# Patient Record
Sex: Female | Born: 1995 | Race: White | Hispanic: No | Marital: Married | Smoking: Former smoker
Health system: Southern US, Community
[De-identification: ages and names within clinical notes are randomized; demographics above are authoritative.]

## PROBLEM LIST (undated history)

## (undated) DIAGNOSIS — F419 Anxiety disorder, unspecified: Secondary | ICD-10-CM

## (undated) DIAGNOSIS — N200 Calculus of kidney: Secondary | ICD-10-CM

## (undated) DIAGNOSIS — Z789 Other specified health status: Secondary | ICD-10-CM

## (undated) DIAGNOSIS — N83201 Unspecified ovarian cyst, right side: Secondary | ICD-10-CM

## (undated) HISTORY — DX: Calculus of kidney: N20.0

## (undated) HISTORY — PX: NO PAST SURGERIES: SHX2092

---
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
## 2015-05-21 ENCOUNTER — Ambulatory Visit (INDEPENDENT_AMBULATORY_CARE_PROVIDER_SITE_OTHER): Payer: Self-pay | Admitting: Physician Assistant

## 2015-05-21 VITALS — BP 110/78 | HR 88 | Temp 98.1°F | Resp 16 | Ht 63.0 in | Wt 173.6 lb

## 2015-05-21 DIAGNOSIS — R109 Unspecified abdominal pain: Secondary | ICD-10-CM

## 2015-05-21 DIAGNOSIS — R300 Dysuria: Secondary | ICD-10-CM

## 2015-05-21 LAB — POCT CBC
GRANULOCYTE PERCENT: 76.4 % (ref 37–80)
HEMATOCRIT: 38 % (ref 37.7–47.9)
HEMOGLOBIN: 12.9 g/dL (ref 12.2–16.2)
LYMPH, POC: 2.5 (ref 0.6–3.4)
MCH, POC: 27.3 pg (ref 27–31.2)
MCHC: 33.8 g/dL (ref 31.8–35.4)
MCV: 80.7 fL (ref 80–97)
MID (cbc): 0.8 (ref 0–0.9)
MPV: 10.1 fL (ref 0–99.8)
POC GRANULOCYTE: 10.5 — AB (ref 2–6.9)
POC LYMPH PERCENT: 17.8 %L (ref 10–50)
POC MID %: 5.8 %M (ref 0–12)
Platelet Count, POC: 212 10*3/uL (ref 142–424)
RBC: 4.71 M/uL (ref 4.04–5.48)
RDW, POC: 13.6 %
WBC: 13.8 10*3/uL — AB (ref 4.6–10.2)

## 2015-05-21 LAB — POCT URINALYSIS DIP (MANUAL ENTRY)
BILIRUBIN UA: NEGATIVE
Bilirubin, UA: NEGATIVE
Glucose, UA: NEGATIVE
Nitrite, UA: NEGATIVE
PH UA: 7
PROTEIN UA: NEGATIVE
SPEC GRAV UA: 1.02
Urobilinogen, UA: 0.2

## 2015-05-21 LAB — POCT URINE PREGNANCY: Preg Test, Ur: NEGATIVE

## 2015-05-21 LAB — POC MICROSCOPIC URINALYSIS (UMFC): Mucus: ABSENT

## 2015-05-21 MED ORDER — CIPROFLOXACIN HCL 500 MG PO TABS: 500.0000 mg | ORAL_TABLET | Freq: Two times a day (BID) | ORAL | Status: AC

## 2015-05-21 NOTE — Progress Notes (Signed)
  Medical screening examination/treatment/procedure(s) were performed by non-physician practitioner and as supervising physician I was immediately available for consultation/collaboration.     

## 2015-05-21 NOTE — Patient Instructions (Signed)
Get plenty of rest and drink at least 64 ounces of water daily.   Pyelonephritis, Adult Pyelonephritis is a kidney infection. The kidneys are the organs that filter a person's blood and move waste out of the bloodstream and into the urine. Urine passes from the kidneys, through the ureters, and into the bladder. There are two main types of pyelonephritis:  Infections that come on quickly without any warning (acute pyelonephritis).  Infections that last for a long period of time (chronic pyelonephritis). In most cases, the infection clears up with treatment and does not cause further problems. More severe infections or chronic infections can sometimes spread to the bloodstream or lead to other problems with the kidneys. CAUSES This condition is usually caused by:  Bacteria traveling from the bladder to the kidney through infected urine. The urine in the bladder can become infected with bacteria from:  Bladder infection (cystitis).  Inflammation of the prostate gland (prostatitis).  Sexual intercourse, in females.  Bacteria traveling from the bloodstream to the kidney. RISK FACTORS This condition is more likely to develop in:  Pregnant women.  Older people.  People who have diabetes.  People who have kidney stones or bladder stones.  People who have other abnormalities of the kidney or ureter.  People who have a catheter placed in the bladder.  People who have cancer.  People who are sexually active.  Women who use spermicides.  People who have had a prior urinary tract infection. SYMPTOMS Symptoms of this condition include:  Frequent urination.  Strong or persistent urge to urinate.  Burning or stinging when urinating.  Abdominal pain.  Back pain.  Pain in the side or flank area.  Fever.  Chills.  Blood in the urine, or dark urine.  Nausea.  Vomiting. DIAGNOSIS This condition may be diagnosed based on:  Medical history and physical exam.  Urine  tests.  Blood tests. You may also have imaging tests of the kidneys, such as an ultrasound or CT scan. TREATMENT Treatment for this condition may depend on the severity of the infection.  If the infection is mild and is found early, you may be treated with antibiotic medicines taken by mouth. You will need to drink fluids to remain hydrated.  If the infection is more severe, you may need to stay in the hospital and receive antibiotics given directly into a vein through an IV tube. You may also need to receive fluids through an IV tube if you are not able to remain hydrated. After your hospital stay, you may need to take oral antibiotics for a period of time. Other treatments may be required, depending on the cause of the infection. HOME CARE INSTRUCTIONS Medicines  Take over-the-counter and prescription medicines only as told by your health care provider.  If you were prescribed an antibiotic medicine, take it as told by your health care provider. Do not stop taking the antibiotic even if you start to feel better. General Instructions  Drink enough fluid to keep your urine clear or pale yellow.  Avoid caffeine, tea, and carbonated beverages. They tend to irritate the bladder.  Urinate often. Avoid holding in urine for long periods of time.  Urinate before and after sex.  After a bowel movement, women should cleanse from front to back. Use each tissue only once.  Keep all follow-up visits as told by your health care provider. This is important. SEEK MEDICAL CARE IF:  Your symptoms do not get better after 2 days of treatment.  Your  symptoms get worse.  You have a fever. SEEK IMMEDIATE MEDICAL CARE IF:  You are unable to take your antibiotics or fluids.  You have shaking chills.  You vomit.  You have severe flank or back pain.  You have extreme weakness or fainting.   This information is not intended to replace advice given to you by your health care provider. Make sure  you discuss any questions you have with your health care provider.   Document Released: 04/18/2005 Document Revised: 01/07/2015 Document Reviewed: 08/11/2014 Elsevier Interactive Patient Education [employer].

## 2015-05-21 NOTE — Addendum Note (Signed)
Addended by: Roselee Culver on: 05/21/2015 04:53 PM   Modules accepted: Miquel Dunn

## 2015-05-21 NOTE — Progress Notes (Signed)
Subjective:    Patient ID: Selena Shepard, female    DOB: 10/18/1995, 20 y.o.   MRN: [id number]  Chief Complaint  Patient presents with  . Urinary Frequency    05/21/2015  . Abdominal Pain    HPI Patient presents today with chief complain of abdominal pain and increased urinary frequency. Past medical history includes nephrolithiasis. She first started noticing her hip/flank pain b/l when it awoke her around 12am, and would keep her up the rest of the night. She notes there is a constant dull pain, which she rates as a 3/10, and a strong pain that comes on/off, which she rates as a 6-7/10 and lasts 20 seconds. Around 3-4am she felt like she had to urinate but when tried she was unable too. She notes she would sit on the toliet for 20-30 minutes before being able to go. When she does, she notes a burning sensation, a decreased amount (drops), and blood in her urine. After being able to pass, the urge would return shortly after. She has not tried anything to relieve the pain.    She presents today with her husband. Her last sexual encounter was 05/18/15. Her LMP was the end of December, she is unsure of the date. Her periods are irregular ranging  she is not regular between every 19-30 days and states this is a different type of pain.   Review of Systems  Constitutional: Positive for chills and appetite change. Negative for fever, diaphoresis and fatigue.  Respiratory: Negative for cough and shortness of breath.   Cardiovascular: Negative for chest pain and palpitations.  Gastrointestinal: Negative for nausea, vomiting, diarrhea and abdominal distention.  Genitourinary: Positive for dysuria, urgency, frequency, hematuria, flank pain and difficulty urinating. Negative for vaginal bleeding, vaginal discharge and vaginal pain.   No Known Allergies  No Prior Medications.   There are no active problems to display for this patient.      Objective:   Physical Exam  Constitutional:  She appears well-developed and well-nourished.  BP 110/78 mmHg  Pulse 88  Temp(Src) 98.1 F (36.7 C) (Oral)  Resp 16  Ht 5\' 3"  (1.6 m)  Wt 173 lb 9.6 oz (78.744 kg)  BMI 30.76 kg/m2  LMP 05/27/2014   HENT:  Head: Normocephalic and atraumatic.  Mouth/Throat: No oropharyngeal exudate.  Eyes: Conjunctivae are normal. Pupils are equal, round, and reactive to light.  Neck: Neck supple.  Cardiovascular: Normal rate, regular rhythm, S1 normal, S2 normal and normal heart sounds.   Pulmonary/Chest: Effort normal and breath sounds normal.  Abdominal: She exhibits no mass. There is no hepatosplenomegaly. There is tenderness in the right lower quadrant, suprapubic area and left lower quadrant. There is CVA tenderness (b/l). There is no rigidity, no rebound, no guarding, no tenderness at McBurney's point and negative Murphy's sign.  Patient did not want to expose abdomen. Unable to hear bowel sounds over gown.   Lymphadenopathy:       Head (right side): No submental, no submandibular, no tonsillar, no preauricular, no posterior auricular and no occipital adenopathy present.       Head (left side): No submental, no submandibular, no tonsillar, no preauricular, no posterior auricular and no occipital adenopathy present.    She has no cervical adenopathy.  Vitals reviewed.  Results for orders placed or performed in visit on 05/21/15  POCT CBC  Result Value Ref Range   WBC 13.8 (A) 4.6 - 10.2 K/uL   Lymph, poc 2.5 0.6 - 3.4  POC LYMPH PERCENT 17.8 10 - 50 %L   MID (cbc) 0.8 0 - 0.9   POC MID % 5.8 0 - 12 %M   POC Granulocyte 10.5 (A) 2 - 6.9   Granulocyte percent 76.4 37 - 80 %G   RBC 4.71 4.04 - 5.48 M/uL   Hemoglobin 12.9 12.2 - 16.2 g/dL   HCT, POC 38.0 37.7 - 47.9 %   MCV 80.7 80 - 97 fL   MCH, POC 27.3 27 - 31.2 pg   MCHC 33.8 31.8 - 35.4 g/dL   RDW, POC 13.6 %   Platelet Count, POC 212 142 - 424 K/uL   MPV 10.1 0 - 99.8 fL  POCT urinalysis dipstick  Result Value Ref Range    Color, UA yellow yellow   Clarity, UA clear clear   Glucose, UA negative negative   Bilirubin, UA negative negative   Ketones, POC UA negative negative   Spec Grav, UA 1.020    Blood, UA moderate (A) negative   pH, UA 7.0    Protein Ur, POC negative negative   Urobilinogen, UA 0.2    Nitrite, UA Negative Negative   Leukocytes, UA large (3+) (A) Negative  POCT Microscopic Urinalysis ([facility])  Result Value Ref Range   WBC,UR,HPF,POC Too numerous to count  (A) None WBC/hpf   RBC,UR,HPF,POC Few (A) None RBC/hpf   Bacteria Moderate (A) None, Too numerous to count   Mucus Absent Absent   Epithelial Cells, UR Per Microscopy Few (A) None, Too numerous to count cells/hpf  POCT urine pregnancy  Result Value Ref Range   Preg Test, Ur Negative Negative        Assessment & Plan:  1. Bilateral flank pain 2. Dysuria - POCT CBC - POCT urinalysis dipstick - POCT Microscopic Urinalysis ([facility]) - POCT urine pregnancy - ciprofloxacin (CIPRO) 500 MG tablet; Take 1 tablet (500 mg total) by mouth 2 (two) times daily.  Dispense: 20 tablet; Refill: 0 - Urine culture  Return in about 2 days (around 05/23/2015) for re-evaluation, sooner if your symptoms worsen.

## 2015-05-21 NOTE — Progress Notes (Signed)
Subjective:   Patient ID: Selena Shepard, female     DOB: January 15, 1996, 20 y.o.    MRN: [id number]  PCP: No primary care provider on file.  Chief Complaint  Patient presents with  . Urinary Frequency    05/21/2015  . Abdominal Pain    HPI  Presents for evaluation of abdominal pain and increased urinary frequency.   Past medical history includes nephrolithiasis. She first started noticing her hip/flank pain b/l when it awoke her around 12am, and would keep her up the rest of the night. She notes there is a constant dull pain, which she rates as a 3/10, and a strong pain that comes on/off, which she rates as a 6-7/10 and lasts 20 seconds. Around 3-4am she felt like she had to urinate but when tried she was unable too. She notes she would sit on the toliet for 20-30 minutes before being able to go. When she does, she notes a burning sensation, a decreased amount (drops), and blood in her urine. After being able to pass, the urge would return shortly after. She has not tried anything to relieve the pain.   She presents today with her husband. Her last sexual encounter was 05/18/15. Her LMP was the end of December, she is unsure of the date. Her periods are irregular ranging19-30 days and states this is a different type of pain. .  She is accompanied today by her husband and his mother.  Prior to Admission medications   Not on File     No Known Allergies   There are no active problems to display for this patient.    Family History  Problem Relation Age of Onset  . Diabetes Paternal Grandmother   . Heart disease Paternal Grandmother   . Hypertension Paternal Grandmother   . Stroke Paternal Grandfather   . Hypertension Paternal Grandfather      Social History   Social History  . Marital Status: Married    Spouse Name: N/A  . Number of Children: N/A  . Years of Education: N/A   Occupational History  . Not on file.   Social History Main Topics  . Smoking  status: Current Some Day Smoker -- 4 years    Types: Cigarettes  . Smokeless tobacco: Never Used     Comment: Social smoker, 4-5 cigarettes, 2x/year  . Alcohol Use: No  . Drug Use: No  . Sexual Activity: Yes    Birth Control/ Protection: Condom   Other Topics Concern  . Not on file   Social History Narrative        Review of Systems Constitutional: Positive for chills and appetite change. Negative for fever, diaphoresis and fatigue.  Respiratory: Negative for cough and shortness of breath.  Cardiovascular: Negative for chest pain and palpitations.  Gastrointestinal: Negative for nausea, vomiting, diarrhea and abdominal distention.  Genitourinary: Positive for dysuria, urgency, frequency, hematuria, flank pain and difficulty urinating. Negative for vaginal bleeding, vaginal discharge and vaginal pain.       Objective:  Physical Exam  Constitutional: She is oriented to person, place, and time. Vital signs are normal. She appears well-developed and well-nourished. No distress.  BP 110/78 mmHg  Pulse 88  Temp(Src) 98.1 F (36.7 C) (Oral)  Resp 16  Ht 5\' 3"  (1.6 m)  Wt 173 lb 9.6 oz (78.744 kg)  BMI 30.76 kg/m2  LMP 05/27/2014   HENT:  Head: Normocephalic and atraumatic.  Cardiovascular: Normal rate, regular rhythm and normal heart sounds.  Pulmonary/Chest: Effort normal and breath sounds normal.  Abdominal: Soft. Normal appearance and bowel sounds are normal. She exhibits no distension and no mass. There is no hepatosplenomegaly. There is tenderness in the right lower quadrant, suprapubic area and left lower quadrant. There is CVA tenderness (bilateral). There is no rigidity, no rebound, no guarding, no tenderness at McBurney's point and negative Murphy's sign. No hernia.  Very reluctant to expose her abdomen for exam, because she is self-conscious of her weight. She did allow for a complete exam.  Musculoskeletal: Normal range of motion.       Lumbar back: Normal.    Neurological: She is alert and oriented to person, place, and time.  Skin: Skin is warm and dry. No rash noted. She is not diaphoretic. No pallor.  Psychiatric: She has a normal mood and affect. Her speech is normal and behavior is normal. Judgment normal.       Results for orders placed or performed in visit on 05/21/15  POCT CBC  Result Value Ref Range   WBC 13.8 (A) 4.6 - 10.2 K/uL   Lymph, poc 2.5 0.6 - 3.4   POC LYMPH PERCENT 17.8 10 - 50 %L   MID (cbc) 0.8 0 - 0.9   POC MID % 5.8 0 - 12 %M   POC Granulocyte 10.5 (A) 2 - 6.9   Granulocyte percent 76.4 37 - 80 %G   RBC 4.71 4.04 - 5.48 M/uL   Hemoglobin 12.9 12.2 - 16.2 g/dL   HCT, POC 38.0 37.7 - 47.9 %   MCV 80.7 80 - 97 fL   MCH, POC 27.3 27 - 31.2 pg   MCHC 33.8 31.8 - 35.4 g/dL   RDW, POC 13.6 %   Platelet Count, POC 212 142 - 424 K/uL   MPV 10.1 0 - 99.8 fL  POCT urinalysis dipstick  Result Value Ref Range   Color, UA yellow yellow   Clarity, UA clear clear   Glucose, UA negative negative   Bilirubin, UA negative negative   Ketones, POC UA negative negative   Spec Grav, UA 1.020    Blood, UA moderate (A) negative   pH, UA 7.0    Protein Ur, POC negative negative   Urobilinogen, UA 0.2    Nitrite, UA Negative Negative   Leukocytes, UA large (3+) (A) Negative  POCT Microscopic Urinalysis ([facility])  Result Value Ref Range   WBC,UR,HPF,POC Too numerous to count  (A) None WBC/hpf   RBC,UR,HPF,POC Few (A) None RBC/hpf   Bacteria Moderate (A) None, Too numerous to count   Mucus Absent Absent   Epithelial Cells, UR Per Microscopy Few (A) None, Too numerous to count cells/hpf  POCT urine pregnancy  Result Value Ref Range   Preg Test, Ur Negative Negative         Assessment & Plan:  1. Bilateral flank pain 2. Dysuria Early pyelonephritis, though nephrolithiasis cannot be excluded. Re-evaluate in 48 hours, sooner if symptoms worsen. Consider CT abd/pelvis if not improving. - POCT CBC - POCT urinalysis  dipstick - POCT Microscopic Urinalysis ([facility]) - POCT urine pregnancy - ciprofloxacin (CIPRO) 500 MG tablet; Take 1 tablet (500 mg total) by mouth 2 (two) times daily.  Dispense: 20 tablet; Refill: 0 - Urine culture   Fara Chute, PA-C Physician Assistant-Certified [facility]

## 2015-05-22 ENCOUNTER — Telehealth: Payer: Self-pay

## 2015-05-22 NOTE — Telephone Encounter (Signed)
Patient is experiencing side effects from taking ciprofloxacin. She's having chest pain, sluggishness, and limbs numbness. She's also having trouble sleeping. Please advise!  [phone]

## 2015-05-23 MED ORDER — SULFAMETHOXAZOLE-TRIMETHOPRIM 800-160 MG PO TABS: 1.0000 | ORAL_TABLET | Freq: Two times a day (BID) | ORAL | Status: AC

## 2015-05-23 NOTE — Telephone Encounter (Signed)
Patient called this am requesting a different abx. She states the cipro has too many side effects. It made her feel dizzy, faint, heart beat felt irregular. Please advise

## 2015-05-23 NOTE — Telephone Encounter (Signed)
I have changed to septra.

## 2015-05-23 NOTE — Telephone Encounter (Signed)
Patient notified and voiced understanding.

## 2015-05-25 ENCOUNTER — Telehealth: Payer: Self-pay

## 2015-05-25 NOTE — Telephone Encounter (Signed)
Please call the patient and tell her what happened. If she is still having symptoms, RTC for re-evaluation. If symptoms are resolved, apologize, and she can RTC PRN.

## 2015-05-25 NOTE — Telephone Encounter (Signed)
Urine culture was sent without pt name/dob. Anything I can do?

## 2015-05-26 NOTE — Telephone Encounter (Signed)
Left message on machine to call back  

## 2015-05-31 NOTE — Telephone Encounter (Signed)
Pt notified and is now better

## 2015-07-17 ENCOUNTER — Ambulatory Visit (INDEPENDENT_AMBULATORY_CARE_PROVIDER_SITE_OTHER): Payer: Self-pay | Admitting: Family Medicine

## 2015-07-17 VITALS — BP 112/70 | HR 105 | Temp 98.3°F | Resp 18 | Ht 63.0 in | Wt 177.2 lb

## 2015-07-17 DIAGNOSIS — R05 Cough: Secondary | ICD-10-CM

## 2015-07-17 DIAGNOSIS — J111 Influenza due to unidentified influenza virus with other respiratory manifestations: Secondary | ICD-10-CM

## 2015-07-17 DIAGNOSIS — R059 Cough, unspecified: Secondary | ICD-10-CM

## 2015-07-17 MED ORDER — OSELTAMIVIR PHOSPHATE 75 MG PO CAPS: 75.0000 mg | ORAL_CAPSULE | Freq: Two times a day (BID) | ORAL | Status: DC

## 2015-07-17 MED ORDER — HYDROCODONE-HOMATROPINE 5-1.5 MG/5ML PO SYRP: 5.0000 mL | ORAL_SOLUTION | Freq: Three times a day (TID) | ORAL | Status: DC | PRN

## 2015-07-17 NOTE — Patient Instructions (Signed)
Drink plenty of fluids and get enough rest  Take Tamiflu 75 mg one twice daily. This usually shortens the length and severity of the course, but you will still be sick for a few days probably.  Take Tylenol 500 mg 2 pills 3 times daily as needed for fever or body aches (acetaminophen) or ibuprofen 200 mg 3 pills 3 times daily also for fever or body ache  Take Hycodan cough syrup 1 teaspoon every 4-6 hours as needed for cough  If you're having nasal congestion and drainage you can take over-the-counter generic Claritin-D or Allegra-D or Zyrtec-D to try and open up your hands little bit.(Loratadine D, fexofenadine D, or cetirizine D are the generics)  Return if increasing shortness of breath, worse fever, getting sicker or any other changes of concern.

## 2015-07-17 NOTE — Progress Notes (Signed)
Patient ID: Selena Shepard, female    DOB: [birth year]  Age: 20 y.o. MRN: [id number]  Chief Complaint  Patient presents with  . Fever    unspecified  . Sore Throat  . Cough    Slightly productive  . Nasal Congestion    x 1 week  . Generalized Body Aches    Subjective:   Healthy-appearing young lady who is here with history of having been ill with a little congestion over the last week or so. However it was yesterday that she started getting sick with fever. She has had a temperature up to 101. She has some body aches. She is coughing more. She still has some nasal congestion. She does not smoke. 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  Current allergies, medications, problem list, past/family and social histories reviewed.  Objective:  BP 112/70 mmHg  Pulse 105  Temp(Src) 98.3 F (36.8 C) (Oral)  Resp 18  Ht 5\' 3"  (1.6 m)  Wt 177 lb 3.2 oz (80.377 kg)  BMI 31.40 kg/m2  SpO2 98%  LMP 07/08/2015  No major acute distress. TMs normal. Throat mild erythema. Neck supple without significant nodes. Chest is clear to auscultation. Heart regular without murmur.  Assessment & Plan:   Assessment: No diagnosis found.    Plan: Clinically she has the flu. Decided not to do a flu swab and just go ahead and treat  No orders of the defined types were placed in this encounter.    Meds ordered this encounter  Medications  . Acetaminophen (TYLENOL PO)    Sig: Take by mouth.         There are no Patient Instructions on file for this visit.   No Follow-up on file.   Ikran Patman, MD 07/17/2015

## 2016-02-13 ENCOUNTER — Inpatient Hospital Stay (HOSPITAL_COMMUNITY)
Admission: AD | Admit: 2016-02-13 | Discharge: 2016-02-14 | Disposition: A | Discharge status: Home / Self Care | Payer: Self-pay | Source: Ambulatory Visit | Attending: Obstetrics and Gynecology | Admitting: Obstetrics and Gynecology

## 2016-02-13 ENCOUNTER — Encounter (HOSPITAL_COMMUNITY): Payer: Self-pay | Admitting: *Deleted

## 2016-02-13 DIAGNOSIS — Z87891 Personal history of nicotine dependence: Secondary | ICD-10-CM | POA: Insufficient documentation

## 2016-02-13 DIAGNOSIS — M545 Low back pain: Secondary | ICD-10-CM | POA: Insufficient documentation

## 2016-02-13 DIAGNOSIS — R102 Pelvic and perineal pain: Secondary | ICD-10-CM | POA: Insufficient documentation

## 2016-02-13 HISTORY — DX: Other specified health status: Z78.9

## 2016-02-13 LAB — URINALYSIS, ROUTINE W REFLEX MICROSCOPIC
Bilirubin Urine: NEGATIVE
GLUCOSE, UA: NEGATIVE mg/dL
HGB URINE DIPSTICK: NEGATIVE
Ketones, ur: NEGATIVE mg/dL
LEUKOCYTES UA: NEGATIVE
Nitrite: NEGATIVE
PROTEIN: NEGATIVE mg/dL
SPECIFIC GRAVITY, URINE: 1.01 (ref 1.005–1.030)
pH: 6 (ref 5.0–8.0)

## 2016-02-13 LAB — POCT PREGNANCY, URINE: PREG TEST UR: NEGATIVE

## 2016-02-13 NOTE — MAU Note (Signed)
Having pain for couple days R side that goesacross lower back. Usually comes and goes but has been constant, sharp pain for last hour and half. LMP 8/12. Did upt but does not think did it correctly. Denies vag spotting. Some clear d/c

## 2016-02-14 DIAGNOSIS — R102 Pelvic and perineal pain: Secondary | ICD-10-CM

## 2016-02-14 LAB — CBC
HCT: 38 % (ref 36.0–46.0)
Hemoglobin: 12.8 g/dL (ref 12.0–15.0)
MCH: 27.2 pg (ref 26.0–34.0)
MCHC: 33.7 g/dL (ref 30.0–36.0)
MCV: 80.7 fL (ref 78.0–100.0)
PLATELETS: 278 10*3/uL (ref 150–400)
RBC: 4.71 MIL/uL (ref 3.87–5.11)
RDW: 14.1 % (ref 11.5–15.5)
WBC: 11.1 10*3/uL — AB (ref 4.0–10.5)

## 2016-02-14 LAB — HCG, QUANTITATIVE, PREGNANCY: hCG, Beta Chain, Quant, S: <1 m[IU]/mL (ref <5)

## 2016-02-14 LAB — WET PREP, GENITAL
Clue Cells Wet Prep HPF POC: NONE SEEN
Sperm: NONE SEEN
TRICH WET PREP: NONE SEEN
WBC, Wet Prep HPF POC: NONE SEEN
YEAST WET PREP: NONE SEEN

## 2016-02-14 MED ORDER — IBUPROFEN 600 MG PO TABS
600.0000 mg | ORAL_TABLET | Freq: Once | ORAL | Status: AC
  Administered 2016-02-14: 600 mg via ORAL
  Filled 2016-02-14: qty 1

## 2016-02-14 MED ORDER — IBUPROFEN 600 MG PO TABS: 600.0000 mg | ORAL_TABLET | Freq: Four times a day (QID) | ORAL | 0 refills | Status: DC | PRN

## 2016-02-14 NOTE — Discharge Instructions (Signed)
Pelvic Pain, Female °Female pelvic pain can be caused by many different things and start from a variety of places. Pelvic pain refers to pain that is located in the lower half of the abdomen and between your hips. The pain may occur over a short period of time (acute) or may be reoccurring (chronic). The cause of pelvic pain may be related to disorders affecting the female reproductive organs (gynecologic), but it may also be related to the bladder, kidney stones, an intestinal complication, or muscle or skeletal problems. Getting help right away for pelvic pain is important, especially if there has been severe, sharp, or a sudden onset of unusual pain. It is also important to get help right away because some types of pelvic pain can be life threatening.  °CAUSES  °Below are only some of the causes of pelvic pain. The causes of pelvic pain can be in one of several categories.  °· Gynecologic. °¨ Pelvic inflammatory disease. °¨ Sexually transmitted infection. °¨ Ovarian cyst or a twisted ovarian ligament (ovarian torsion). °¨ Uterine lining that grows outside the uterus (endometriosis). °¨ Fibroids, cysts, or tumors. °¨ Ovulation. °· Pregnancy. °¨ Pregnancy that occurs outside the uterus (ectopic pregnancy). °¨ Miscarriage. °¨ Labor. °¨ Abruption of the placenta or ruptured uterus. °· Infection. °¨ Uterine infection (endometritis). °¨ Bladder infection. °¨ Diverticulitis. °¨ Miscarriage related to a uterine infection (septic abortion). °· Bladder. °¨ Inflammation of the bladder (cystitis). °¨ Kidney stone(s). °· Gastrointestinal. °¨ Constipation. °¨ Diverticulitis. °· Neurologic. °¨ Trauma. °¨ Feeling pelvic pain because of mental or emotional causes (psychosomatic). °· Cancers of the bowel or pelvis. °EVALUATION  °Your caregiver will want to take a careful history of your concerns. This includes recent changes in your health, a careful gynecologic history of your periods (menses), and a sexual history. Obtaining  your family history and medical history is also important. Your caregiver may suggest a pelvic exam. A pelvic exam will help identify the location and severity of the pain. It also helps in the evaluation of which organ system may be involved. In order to identify the cause of the pelvic pain and be properly treated, your caregiver may order tests. These tests may include:  °· A pregnancy test. °· Pelvic ultrasonography. °· An X-ray exam of the abdomen. °· A urinalysis or evaluation of vaginal discharge. °· Blood tests. °HOME CARE INSTRUCTIONS  °· Only take over-the-counter or prescription medicines for pain, discomfort, or fever as directed by your caregiver.   °· Rest as directed by your caregiver.   °· Eat a balanced diet.   °· Drink enough fluids to make your urine clear or pale yellow, or as directed.   °· Avoid sexual intercourse if it causes pain.   °· Apply warm or cold compresses to the lower abdomen depending on which one helps the pain.   °· Avoid stressful situations.   °· Keep a journal of your pelvic pain. Write down when it started, where the pain is located, and if there are things that seem to be associated with the pain, such as food or your menstrual cycle. °· Follow up with your caregiver as directed.   °SEEK MEDICAL CARE IF: °· Your medicine does not help your pain. °· You have abnormal vaginal discharge. °SEEK IMMEDIATE MEDICAL CARE IF:  °· You have heavy bleeding from the vagina.   °· Your pelvic pain increases.   °· You feel light-headed or faint.   °· You have chills.   °· You have pain with urination or blood in your urine.   °· You have uncontrolled   diarrhea or vomiting.   You have a fever or persistent symptoms for more than 3 days.  You have a fever and your symptoms suddenly get worse.   You are being physically or sexually abused.   This information is not intended to replace advice given to you by your health care provider. Make sure you discuss any questions you have with  your health care provider.   Document Released: 03/15/2004 Document Revised: 01/07/2015 Document Reviewed: 08/08/2011 Elsevier Interactive Patient Education 2016 Elsevier Inc. Dysmenorrhea Menstrual cramps (dysmenorrhea) are caused by the muscles of the uterus tightening (contracting) during a menstrual period. For some women, this discomfort is merely bothersome. For others, dysmenorrhea can be severe enough to interfere with everyday activities for a few days each month. Primary dysmenorrhea is menstrual cramps that last a couple of days when you start having menstrual periods or soon after. This often begins after a teenager starts having her period. As a woman gets older or has a baby, the cramps will usually lessen or disappear. Secondary dysmenorrhea begins later in life, lasts longer, and the pain may be stronger than primary dysmenorrhea. The pain may start before the period and last a few days after the period.  CAUSES  Dysmenorrhea is usually caused by an underlying problem, such as:  The tissue lining the uterus grows outside of the uterus in other areas of the body (endometriosis).  The endometrial tissue, which normally lines the uterus, is found in or grows into the muscular walls of the uterus (adenomyosis).  The pelvic blood vessels are engorged with blood just before the menstrual period (pelvic congestive syndrome).  Overgrowth of cells (polyps) in the lining of the uterus or cervix.  Falling down of the uterus (prolapse) because of loose or stretched ligaments.  Depression.  Bladder problems, infection, or inflammation.  Problems with the intestine, a tumor, or irritable bowel syndrome.  Cancer of the female organs or bladder.  A severely tipped uterus.  A very tight opening or closed cervix.  Noncancerous tumors of the uterus (fibroids).  Pelvic inflammatory disease (PID).  Pelvic scarring (adhesions) from a previous surgery.  Ovarian cyst.  An  intrauterine device (IUD) used for birth control. RISK FACTORS You may be at greater risk of dysmenorrhea if:  You are younger than age 65.  You started puberty early.  You have irregular or heavy bleeding.  You have never given birth.  You have a family history of this problem.  You are a smoker. SIGNS AND SYMPTOMS   Cramping or throbbing pain in your lower abdomen.  Headaches.  Lower back pain.  Nausea or vomiting.  Diarrhea.  Sweating or dizziness.  Loose stools. DIAGNOSIS  A diagnosis is based on your history, symptoms, physical exam, diagnostic tests, or procedures. Diagnostic tests or procedures may include:  Blood tests.  Ultrasonography.  An examination of the lining of the uterus (dilation and curettage, D&C).  An examination inside your abdomen or pelvis with a scope (laparoscopy).  X-rays.  CT scan.  MRI.  An examination inside the bladder with a scope (cystoscopy).  An examination inside the intestine or stomach with a scope (colonoscopy, gastroscopy). TREATMENT  Treatment depends on the cause of the dysmenorrhea. Treatment may include:  Pain medicine prescribed by your health care provider.  Birth control pills or an IUD with progesterone hormone in it.  Hormone replacement therapy.  Nonsteroidal anti-inflammatory drugs (NSAIDs). These may help stop the production of prostaglandins.  Surgery to remove adhesions, endometriosis, ovarian  cyst, or fibroids.  Removal of the uterus (hysterectomy).  Progesterone shots to stop the menstrual period.  Cutting the nerves on the sacrum that go to the female organs (presacral neurectomy).  Electric current to the sacral nerves (sacral nerve stimulation).  Antidepressant medicine.  Psychiatric therapy, counseling, or group therapy.  Exercise and physical therapy.  Meditation and yoga therapy.  Acupuncture. HOME CARE INSTRUCTIONS   Only take over-the-counter or prescription medicines as  directed by your health care provider.  Place a heating pad or hot water bottle on your lower back or abdomen. Do not sleep with the heating pad.  Use aerobic exercises, walking, swimming, biking, and other exercises to help lessen the cramping.  Massage to the lower back or abdomen may help.  Stop smoking.  Avoid alcohol and caffeine. SEEK MEDICAL CARE IF:   Your pain does not get better with medicine.  You have pain with sexual intercourse.  Your pain increases and is not controlled with medicines.  You have abnormal vaginal bleeding with your period.  You develop nausea or vomiting with your period that is not controlled with medicine. SEEK IMMEDIATE MEDICAL CARE IF:  You pass out.    This information is not intended to replace advice given to you by your health care provider. Make sure you discuss any questions you have with your health care provider.   Document Released: 04/18/2005 Document Revised: 12/19/2012 Document Reviewed: 10/04/2012 Elsevier Interactive Patient Education [employer].

## 2016-02-14 NOTE — Progress Notes (Signed)
Written and verbal d/c instructions given and understanding voiced. 

## 2016-02-14 NOTE — MAU Provider Note (Signed)
Chief Complaint: Back Pain and Possible Pregnancy   First Provider Initiated Contact with Patient 02/14/16 0132      SUBJECTIVE HPI: Selena Shepard is a 20 y.o. G0 who presents to maternity admissions reporting onset of cramping low abdomen and low back pain bilaterally 2-3 days ago with menses 5 weeks late.  She took a pregnancy test at home that was faintly positive but she is concerned that she did not follow the directions correctly.  Her pain is bilateral but worse on right side, a dull pain all the time with intermittent sharp pain both in her low abdomen and low back.  Nothing makes the pain better or worse.  It is gradually worsening since onset.  She has never had this pain before. She denies vaginal bleeding, vaginal itching/burning, urinary symptoms, h/a, dizziness, n/v, or fever/chills.     HPI  Past Medical History:  Diagnosis Date  . Medical history non-contributory   . Nephrolithiasis 2013   Past Surgical History:  Procedure Laterality Date  . NO PAST SURGERIES     Social History   Social History  . Marital status: Married    Spouse name: N/A  . Number of children: N/A  . Years of education: N/A   Occupational History  . Not on file.   Social History Main Topics  . Smoking status: Former Smoker    Years: 4.00    Types: Cigarettes  . Smokeless tobacco: Never Used     Comment: Social smoker, 4-5 cigarettes, 2x/year  . Alcohol use No  . Drug use: No  . Sexual activity: Yes    Birth control/ protection: Condom   Other Topics Concern  . Not on file   Social History Narrative  . No narrative on file   No current facility-administered medications on file prior to encounter.    Current Outpatient Prescriptions on File Prior to Encounter  Medication Sig Dispense Refill  . Acetaminophen (TYLENOL PO) Take by mouth.     No Known Allergies  ROS:  Review of Systems  Constitutional: Negative for chills, fatigue and fever.  Respiratory: Negative  for shortness of breath.   Cardiovascular: Negative for chest pain.  Genitourinary: Positive for pelvic pain. Negative for difficulty urinating, dysuria, flank pain, vaginal bleeding, vaginal discharge and vaginal pain.  Musculoskeletal: Positive for back pain.  Neurological: Negative for dizziness and headaches.  Psychiatric/Behavioral: Negative.      I have reviewed patient's Past Medical Hx, Surgical Hx, Family Hx, Social Hx, medications and allergies.   Physical Exam   Patient Vitals for the past 24 hrs:  BP Temp Pulse Resp Height Weight  02/14/16 0239 137/78 98.1 F (36.7 C) 88 18 - -  02/13/16 2228 134/87 98.2 F (36.8 C) 92 18 5\' 3"  (1.6 m) 186 lb 12.8 oz (84.7 kg)   Constitutional: Well-developed, well-nourished female in no acute distress.  Cardiovascular: normal rate Respiratory: normal effort GI: Abd soft, non-tender. Pos BS x 4 MS: Extremities nontender, no edema, normal ROM Neurologic: Alert and oriented x 4.  GU: Neg CVAT.  PELVIC EXAM: Cervix pink, visually closed, without lesion, scant white creamy discharge, vaginal walls and external genitalia normal Bimanual exam: Cervix 0/long/high, firm, anterior, neg CMT, uterus nontender, nonenlarged, adnexa without tenderness, enlargement, or mass   LAB RESULTS Results for orders placed or performed during the hospital encounter of 02/13/16 (from the past 24 hour(s))  Urinalysis, Routine w reflex microscopic (not at [facility])     Status: None   Collection  Time: 02/13/16 10:35 PM  Result Value Ref Range   Color, Urine YELLOW YELLOW   APPearance CLEAR CLEAR   Specific Gravity, Urine 1.010 1.005 - 1.030   pH 6.0 5.0 - 8.0   Glucose, UA NEGATIVE NEGATIVE mg/dL   Hgb urine dipstick NEGATIVE NEGATIVE   Bilirubin Urine NEGATIVE NEGATIVE   Ketones, ur NEGATIVE NEGATIVE mg/dL   Protein, ur NEGATIVE NEGATIVE mg/dL   Nitrite NEGATIVE NEGATIVE   Leukocytes, UA NEGATIVE NEGATIVE  Pregnancy, urine POC     Status: None    Collection Time: 02/13/16 11:27 PM  Result Value Ref Range   Preg Test, Ur NEGATIVE NEGATIVE  CBC     Status: Abnormal   Collection Time: 02/14/16 12:53 AM  Result Value Ref Range   WBC 11.1 (H) 4.0 - 10.5 K/uL   RBC 4.71 3.87 - 5.11 MIL/uL   Hemoglobin 12.8 12.0 - 15.0 g/dL   HCT 38.0 36.0 - 46.0 %   MCV 80.7 78.0 - 100.0 fL   MCH 27.2 26.0 - 34.0 pg   MCHC 33.7 30.0 - 36.0 g/dL   RDW 14.1 11.5 - 15.5 %   Platelets 278 150 - 400 K/uL  hCG, quantitative, pregnancy     Status: None   Collection Time: 02/14/16 12:53 AM  Result Value Ref Range   hCG, Beta Chain, Quant, S <1 <5 mIU/mL  Wet prep, genital     Status: None   Collection Time: 02/14/16  1:50 AM  Result Value Ref Range   Yeast Wet Prep HPF POC NONE SEEN NONE SEEN   Trich, Wet Prep NONE SEEN NONE SEEN   Clue Cells Wet Prep HPF POC NONE SEEN NONE SEEN   WBC, Wet Prep HPF POC NONE SEEN NONE SEEN   Sperm NONE SEEN        IMAGING No results found.  MAU Management/MDM: Ordered labs and reviewed results. No evidence of acute abdomen or infection. Quant hcg With late menses, possibly anovulatory cycle causing dysmenorrhea and irregular bleeding pattern. Reassurance provided to pt.  Treatments in MAU included ibuprofen 600 mg PO x 1 dose. Rx for ibuprofen 600 mg PO Q 6 hours PRN.  Reviewed reasons to return to MAU.  Pt stable at time of discharge.  ASSESSMENT 1. Acute pelvic pain, female     PLAN Discharge home   Medication List    STOP taking these medications   HYDROcodone-homatropine 5-1.5 MG/5ML syrup Commonly known as:  HYCODAN   oseltamivir 75 MG capsule Commonly known as:  TAMIFLU     TAKE these medications   ibuprofen 600 MG tablet Commonly known as:  ADVIL,MOTRIN Take 1 tablet (600 mg total) by mouth every 6 (six) hours as needed.   TYLENOL PO Take by mouth.      Follow-up [place] for [facility] .   Specialty:  Obstetrics and Gynecology Why:  For routine Gyn care or  as needed, return to MAU as needed for emergencies Contact information: [place] Certified Nurse-Midwife 02/14/2016  8:52 PM

## 2016-02-15 LAB — GC/CHLAMYDIA PROBE AMP (~~LOC~~) NOT AT ARMC
Chlamydia: NEGATIVE
NEISSERIA GONORRHEA: NEGATIVE

## 2016-06-06 ENCOUNTER — Ambulatory Visit (INDEPENDENT_AMBULATORY_CARE_PROVIDER_SITE_OTHER): Payer: BLUE CROSS/BLUE SHIELD | Admitting: Family Medicine

## 2016-06-06 VITALS — BP 120/80 | HR 106 | Temp 98.1°F | Resp 16 | Ht 64.0 in | Wt 187.0 lb

## 2016-06-06 DIAGNOSIS — R6889 Other general symptoms and signs: Secondary | ICD-10-CM

## 2016-06-06 MED ORDER — OSELTAMIVIR PHOSPHATE 75 MG PO CAPS: 75.0000 mg | ORAL_CAPSULE | Freq: Two times a day (BID) | ORAL | 0 refills | Status: DC

## 2016-06-06 NOTE — Progress Notes (Signed)
Patient ID: Selena Shepard, female    DOB: [birth year], 21 y.o.   MRN: [id number]  PCP: No primary care provider on file.  Chief Complaint  Patient presents with  . Cough    x 2 days  . Nasal Congestion  . Generalized Body Aches    Subjective:  HPI 21 year female presents for evaluation of cough, nasal congestion, and general body aches x 2 days. LMPD May 28, 2016. Report fatigue and "not feeling well".  She has been taking Tylenol and Ibuprofen for body aches. Not taking anything for cough. Denies shortness of breath, wheezing, or  Cough. No chest tenderness with breathing and or coughing. Felt feverish although she hasn't measured with thermometer.  Social History   Social History  . Marital status: Married    Spouse name: N/A  . Number of children: N/A  . Years of education: N/A   Occupational History  . Not on file.   Social History Main Topics  . Smoking status: Former Smoker    Years: 4.00    Types: Cigarettes  . Smokeless tobacco: Never Used     Comment: Social smoker, 4-5 cigarettes, 2x/year  . Alcohol use No  . Drug use: No  . Sexual activity: Yes    Birth control/ protection: Condom   Other Topics Concern  . Not on file   Social History Narrative  . No narrative on file    Family History  Problem Relation Age of Onset  . Diabetes Paternal Grandmother   . Heart disease Paternal Grandmother   . Hypertension Paternal Grandmother   . Stroke Paternal Grandfather   . Hypertension Paternal Grandfather    Review of Systems See HPI No Known Allergies See HPI  Prior to Admission medications   Medication Sig Start Date End Date Taking? Authorizing Provider  Acetaminophen (TYLENOL PO) Take by mouth.   Yes Historical Provider, MD  ibuprofen (ADVIL,MOTRIN) 600 MG tablet Take 1 tablet (600 mg total) by mouth every 6 (six) hours as needed. 02/14/16  Yes Elvera Maria, CNM    Past Medical, Surgical Family and Social History reviewed  and updated.    Objective:   Today's Vitals   06/06/16 1529  BP: 120/80  Pulse: (!) 106  Resp: 16  Temp: 98.1 F (36.7 C)  TempSrc: Oral  SpO2: 95%  Weight: 187 lb (84.8 kg)  Height: 5\' 4"  (1.626 m)    Wt Readings from Last 3 Encounters:  06/06/16 187 lb (84.8 kg)  02/13/16 186 lb 12.8 oz (84.7 kg)  07/17/15 177 lb 3.2 oz (80.4 kg)   Physical Exam  Constitutional: She is oriented to person, place, and time. She appears well-developed and well-nourished.  HENT:  Head: Normocephalic and atraumatic.  Right Ear: External ear normal.  Left Ear: External ear normal.  Nose: Rhinorrhea present.  Eyes: Conjunctivae and EOM are normal. Pupils are equal, round, and reactive to light.  Neck: Normal range of motion. Neck supple.  Cardiovascular: Normal rate, regular rhythm, normal heart sounds and intact distal pulses.   Pulmonary/Chest: Effort normal and breath sounds normal. No respiratory distress. She has no wheezes. She has no rales.  Musculoskeletal: Normal range of motion.  Lymphadenopathy:    She has no cervical adenopathy.  Neurological: She is alert and oriented to person, place, and time.  Skin: Skin is warm and dry.  Psychiatric: She has a normal mood and affect. Her behavior is normal. Judgment and thought content normal.  Assessment & Plan:  1. Flu-like symptoms Plan: Start Tamiflu 75 mg twice daily x 5 days. Continue to hydrate with water and alternate Tylenol and ibuprofen for fever. Return for care if symptoms persists or worsenes.   Carroll Sage. Kenton Kingfisher, MSN, FNP-C [facility] [place]

## 2016-06-06 NOTE — Patient Instructions (Addendum)
Start Tamiflu 75 mg twice daily x 5 days. Continue to hydrate with water and alternate Tylenol and ibuprofen for fever.   IF you received an x-ray today, you will receive an invoice from [facility]. Please contact [facility] at [phone] with questions or concerns regarding your invoice.   IF you received labwork today, you will receive an invoice from [facility]. Please contact [facility] at [phone] with questions or concerns regarding your invoice.   Our billing staff will not be able to assist you with questions regarding bills from these companies.  You will be contacted with the lab results as soon as they are available. The fastest way to get your results is to activate your My Chart account. Instructions are located on the last page of this paperwork. If you have not heard from [place] regarding the results in 2 weeks, please contact this office.      Influenza, Adult Influenza ("the flu") is an infection in the lungs, nose, and throat (respiratory tract). It is caused by a virus. The flu causes many common cold symptoms, as well as a high fever and body aches. It can make you feel very sick. The flu spreads easily from person to person (is contagious). Getting a flu shot (influenza vaccination) every year is the best way to prevent the flu. Follow these instructions at home:  Take over-the-counter and prescription medicines only as told by your doctor.  Use a cool mist humidifier to add moisture (humidity) to the air in your home. This can make it easier to breathe.  Rest as needed.  Drink enough fluid to keep your pee (urine) clear or pale yellow.  Cover your mouth and nose when you cough or sneeze.  Wash your hands with soap and water often, especially after you cough or sneeze. If you cannot use soap and water, use hand sanitizer.  Stay home from work or school as told by your doctor. Unless you are visiting your doctor, try to avoid leaving home until  your fever has been gone for 24 hours without the use of medicine.  Keep all follow-up visits as told by your doctor. This is important. How is this prevented?  Getting a yearly (annual) flu shot is the best way to avoid getting the flu. You may get the flu shot in late summer, fall, or winter. Ask your doctor when you should get your flu shot.  Wash your hands often or use hand sanitizer often.  Avoid contact with people who are sick during cold and flu season.  Eat healthy foods.  Drink plenty of fluids.  Get enough sleep.  Exercise regularly. Contact a doctor if:  You get new symptoms.  You have:  Chest pain.  Watery poop (diarrhea).  A fever.  Your cough gets worse.  You start to have more mucus.  You feel sick to your stomach (nauseous).  You throw up (vomit). Get help right away if:  You start to be short of breath or have trouble breathing.  Your skin or nails turn a bluish color.  You have very bad pain or stiffness in your neck.  You get a sudden headache.  You get sudden pain in your face or ear.  You cannot stop throwing up. This information is not intended to replace advice given to you by your health care provider. Make sure you discuss any questions you have with your health care provider. Document Released: 01/26/2008 Document Revised: 09/24/2015 Document Reviewed: 02/10/2015 Elsevier Interactive Patient Education  2017 [employer] Inc[employer]  

## 2016-11-22 ENCOUNTER — Emergency Department (HOSPITAL_COMMUNITY)
Admission: EM | Admit: 2016-11-22 | Discharge: 2016-11-23 | Disposition: A | Discharge status: Home / Self Care | Payer: BLUE CROSS/BLUE SHIELD | Attending: Emergency Medicine | Admitting: Emergency Medicine

## 2016-11-22 ENCOUNTER — Encounter (HOSPITAL_COMMUNITY): Payer: Self-pay

## 2016-11-22 DIAGNOSIS — M545 Low back pain, unspecified: Secondary | ICD-10-CM

## 2016-11-22 DIAGNOSIS — Z87891 Personal history of nicotine dependence: Secondary | ICD-10-CM | POA: Insufficient documentation

## 2016-11-22 NOTE — ED Provider Notes (Signed)
[place] [facility] DEPT Provider Note   CSN: [id number] Arrival date & time: 11/22/16  2051     History   Chief Complaint Chief Complaint  Patient presents with  . Back Pain    HPI Selena Shepard is a 21 y.o. female.  HPI  Patient presents to ED for evaluation of low back pain that occurred after mechanical fall prior to arrival. She states that she slipped on an object when she landed on her right side. She denies any head injury or loss of consciousness.She has been having lower back pain since then. She is ambulatory and denies any numbness, weakness, urinary incontinence, prior back surgery, history of cancer, history of IV drug use. She denies any urinary symptoms. She is unsure if she is pregnant. She denies any vaginal bleeding or discharge.  Past Medical History:  Diagnosis Date  . Medical history non-contributory   . Nephrolithiasis 2013    There are no active problems to display for this patient.   Past Surgical History:  Procedure Laterality Date  . NO PAST SURGERIES      OB History    No data available       Home Medications    Prior to Admission medications   Medication Sig Start Date End Date Taking? Authorizing Provider  Acetaminophen (TYLENOL PO) Take by mouth.    [provider]  ibuprofen (ADVIL,MOTRIN) 600 MG tablet Take 1 tablet (600 mg total) by mouth every 6 (six) hours as needed. 02/14/16   Leftwich-Kirby, Kathie Dike, CNM  oseltamivir (TAMIFLU) 75 MG capsule Take 1 capsule (75 mg total) by mouth 2 (two) times daily. 06/06/16   Scot Jun, FNP    Family History Family History  Problem Relation Age of Onset  . Diabetes Paternal Grandmother   . Heart disease Paternal Grandmother   . Hypertension Paternal Grandmother   . Stroke Paternal Grandfather   . Hypertension Paternal Grandfather     Social History Social History  Substance Use Topics  . Smoking status: Former Smoker    Years: 4.00    Types: Cigarettes  .  Smokeless tobacco: Never Used     Comment: Social smoker, 4-5 cigarettes, 2x/year  . Alcohol use No     Allergies   Patient has no known allergies.   Review of Systems Review of Systems  Constitutional: Negative for chills and fever.  Gastrointestinal: Positive for nausea. Negative for vomiting.  Genitourinary: Negative for frequency, vaginal bleeding, vaginal discharge and vaginal pain.  Musculoskeletal: Positive for back pain. Negative for arthralgias and myalgias.  Neurological: Negative for weakness, numbness and headaches.     Physical Exam Updated Vital Signs BP (!) 140/95 (BP Location: Left Arm)   Pulse (!) 119   Temp 98.8 F (37.1 C) (Oral)   Resp 18   Ht 5\' 3"  (1.6 m)   Wt 87.2 kg (192 lb 3.2 oz)   LMP 10/31/2016   SpO2 97%   BMI 34.05 kg/m   Physical Exam  Constitutional: She appears well-developed and well-nourished. No distress.  HENT:  Head: Normocephalic and atraumatic.  Eyes: Conjunctivae and EOM are normal. No scleral icterus.  Neck: Normal range of motion.  Pulmonary/Chest: Effort normal. No respiratory distress.  Musculoskeletal: Normal range of motion. She exhibits tenderness. She exhibits no edema or deformity.  Midline lumbar spinal and paraspinal musculature tenderness noted. No midline spinal tenderness present in thoracic or cervical spine. No step-off palpated. No visible bruising, edema or temperature change noted. No objective signs of  numbness present. No saddle anesthesia. 2+ DP pulses bilaterally. Sensation intact to light touch. Strength 5/5 in bilateral lower extremities.   Neurological: She is alert.  Skin: No rash noted. She is not diaphoretic.  Psychiatric: She has a normal mood and affect.  Nursing note and vitals reviewed.    ED Treatments / Results  Labs (all labs ordered are listed, but only abnormal results are displayed) Labs Reviewed  PREGNANCY, URINE    EKG  EKG Interpretation None       Radiology Dg Lumbar  Spine Complete  Result Date: 11/23/2016 CLINICAL DATA:  Ground level fall, landing on her right side. Persistent bilateral lower back pain. EXAM: LUMBAR SPINE - COMPLETE 4+ VIEW COMPARISON:  None. FINDINGS: Limbus vertebral bodies at L3 and L4. No evidence of lumbar spine fracture. No spondylolysis or spondylolisthesis. Good preservation of intervertebral disc spaces. Facet articulations are intact. Sacroiliac joints are unremarkable. IMPRESSION: Negative for acute lumbar spine fracture. Electronically Signed   By: Andreas Newport M.D.   On: 11/23/2016 01:38    Procedures Procedures (including critical care time)  Medications Ordered in ED Medications - No data to display   Initial Impression / Assessment and Plan / ED Course  I have reviewed the triage vital signs and the nursing notes.  Pertinent labs & imaging results that were available during my care of the patient were reviewed by me and considered in my medical decision making (see chart for details).     Patient presents to ED for evaluation of low back pain that occurred after mechanical fall prior to arrival. She denies any previous back surgeries, history of cancer, history of IV drug use, numbness, weakness, gait changes, urinary or bowel incontinence. On physical exam there is tenderness to palpation of the lumbar spine and paraspinal musculature. There is no step-off noted. There are no focal findings on neurological exam. Sensation intact to light touch and strength 5/5 in bilateral lower extremities. I have low suspicion for cauda equina or other acute spinal cord abnormality to be the cause of her back pain.Patient is unsure if she is pregnant. She denies any abdominal pain or abnormal vaginal bleeding. Urine pregnancy negative. X-rays of the lumbar spine were obtained and were negative for acute fracture or dislocation. We'll advise patient to take anti-inflammatories or Tylenol as needed for pain. Patient appears stable for  discharge at this time. Strict return precautions given.  Final Clinical Impressions(s) / ED Diagnoses   Final diagnoses:  Acute midline low back pain without sciatica    New Prescriptions New Prescriptions   No medications on file     Delia Heady, PA-C 11/23/16 [id number]    Tegeler, Gwenyth Allegra, MD 11/23/16 1120

## 2016-11-22 NOTE — ED Triage Notes (Signed)
Pt states she fell tonight in a store and landed on her right side, she complains of lower back pain

## 2016-11-23 ENCOUNTER — Emergency Department (HOSPITAL_COMMUNITY): Payer: BLUE CROSS/BLUE SHIELD

## 2016-11-23 LAB — PREGNANCY, URINE: PREG TEST UR: NEGATIVE

## 2016-11-23 NOTE — Discharge Instructions (Signed)
Please read attached information regarding your condition. Take Tylenol or ibuprofen as needed for pain. Apply warm compresses and stretch area as tolerated. Return to ED for worsening pain, numbness, weakness, additional injury, trouble walking, loss of consciousness.

## 2020-11-30 DIAGNOSIS — Z8616 Personal history of COVID-19: Secondary | ICD-10-CM

## 2020-11-30 HISTORY — DX: Personal history of COVID-19: Z86.16

## 2021-05-19 ENCOUNTER — Other Ambulatory Visit: Payer: Self-pay | Admitting: Obstetrics and Gynecology

## 2021-05-19 DIAGNOSIS — N83201 Unspecified ovarian cyst, right side: Secondary | ICD-10-CM

## 2021-06-02 ENCOUNTER — Ambulatory Visit
Admission: RE | Admit: 2021-06-02 | Discharge: 2021-06-02 | Disposition: A | Discharge status: Home / Self Care | Payer: 59 | Source: Ambulatory Visit | Attending: Obstetrics and Gynecology | Admitting: Obstetrics and Gynecology

## 2021-06-02 DIAGNOSIS — D1803 Hemangioma of intra-abdominal structures: Secondary | ICD-10-CM | POA: Diagnosis not present

## 2021-06-02 DIAGNOSIS — N83201 Unspecified ovarian cyst, right side: Secondary | ICD-10-CM

## 2021-06-02 DIAGNOSIS — D270 Benign neoplasm of right ovary: Secondary | ICD-10-CM | POA: Diagnosis not present

## 2021-06-02 DIAGNOSIS — D271 Benign neoplasm of left ovary: Secondary | ICD-10-CM | POA: Diagnosis not present

## 2021-06-02 DIAGNOSIS — Z8742 Personal history of other diseases of the female genital tract: Secondary | ICD-10-CM | POA: Diagnosis not present

## 2021-06-02 MED ORDER — IOPAMIDOL (ISOVUE-300) INJECTION 61%
100.0000 mL | Freq: Once | INTRAVENOUS | Status: AC | PRN
  Administered 2021-06-02: 100 mL via INTRAVENOUS

## 2021-06-10 DIAGNOSIS — N83299 Other ovarian cyst, unspecified side: Secondary | ICD-10-CM | POA: Diagnosis not present

## 2021-06-25 ENCOUNTER — Telehealth: Payer: Self-pay | Admitting: *Deleted

## 2021-06-25 NOTE — Telephone Encounter (Signed)
Spoke with the patient and scheduled a new patient appt with Dr Berline Lopes on 3/20 at 11:15 am. Patient given the address and phone number for the clinic, along with the policy for mask and visitors

## 2021-07-05 DIAGNOSIS — N83209 Unspecified ovarian cyst, unspecified side: Secondary | ICD-10-CM | POA: Diagnosis not present

## 2021-07-12 DIAGNOSIS — N83202 Unspecified ovarian cyst, left side: Secondary | ICD-10-CM

## 2021-07-14 ENCOUNTER — Encounter (HOSPITAL_BASED_OUTPATIENT_CLINIC_OR_DEPARTMENT_OTHER): Payer: Self-pay | Admitting: Obstetrics and Gynecology

## 2021-07-14 ENCOUNTER — Other Ambulatory Visit: Payer: Self-pay

## 2021-07-14 ENCOUNTER — Telehealth: Payer: Self-pay

## 2021-07-14 NOTE — Telephone Encounter (Signed)
Called patient to review meaningful use for appt on Monday.  Patient stated that she is having surgery on Monday and needs to cancel her appt.  ?

## 2021-07-14 NOTE — Progress Notes (Signed)
Spoke w/ via phone for pre-op interview---pt ?Lab needs dos---- T & S, urine preg              ?Lab results------none ?COVID test -----patient states asymptomatic no test needed ?Arrive at -------530 am 07-19-2021 ?NPO after MN NO Solid Food.  Clear liquids from MN until---430 am ?Med rec completed ?Medications to take morning of surgery -----none ?Diabetic medication -----n/a ?Patient instructed no nail polish to be worn day of surgery ?Patient instructed to bring photo id and insurance card day of surgery ?Patient aware to have Driver (ride ) / caregiver sonny husband will spend night, mother may come to visit until 800 pm in overnight room  for 24 hours after surgery  ?Patient Special Instructions -----pt given overnight stay instructions ?Pre-Op special Istructions -----none ?Patient verbalized understanding of instructions that were given at this phone interview. ?Patient denies shortness of breath, chest pain, fever, cough at this phone interview.  ?

## 2021-07-17 NOTE — Anesthesia Preprocedure Evaluation (Addendum)
Anesthesia Evaluation  ?Patient identified by MRN, date of birth, ID band ?Patient awake ? ? ? ?Reviewed: ?Allergy & Precautions, NPO status , Patient's Chart, lab work & pertinent test results ? ?History of Anesthesia Complications ?Negative for: history of anesthetic complications ? ?Airway ?Mallampati: II ? ?TM Distance: >3 FB ?Neck ROM: Full ? ? ? Dental ? ?(+) Dental Advisory Given ?  ?Pulmonary ?former smoker,  ?  ?breath sounds clear to auscultation ? ? ? ? ? ? Cardiovascular ?negative cardio ROS ? ? ?Rhythm:Regular Rate:Normal ? ? ?  ?Neuro/Psych ?Anxiety negative neurological ROS ?   ? GI/Hepatic ?negative GI ROS, (+)  ?  ? substance abuse ? marijuana use,   ?Endo/Other  ?obese ? Renal/GU ?negative Renal ROS  ? ?  ?Musculoskeletal ? ? Abdominal ?(+) + obese,   ?Peds ? Hematology ?negative hematology ROS ?(+)   ?Anesthesia Other Findings ? ? Reproductive/Obstetrics ? ?  ? ? ? ? ? ? ? ? ? ? ? ? ? ?  ?  ? ? ? ? ? ? ? ?Anesthesia Physical ?Anesthesia Plan ? ?ASA: 2 ? ?Anesthesia Plan: General  ? ?Post-op Pain Management: Tylenol PO (pre-op)*  ? ?Induction: Intravenous ? ?PONV Risk Score and Plan: 3 and Ondansetron, Dexamethasone and Scopolamine patch - Pre-op ? ?Airway Management Planned: Oral ETT ? ?Additional Equipment: None ? ?Intra-op Plan:  ? ?Post-operative Plan: Extubation in OR ? ?Informed Consent: I have reviewed the patients History and Physical, chart, labs and discussed the procedure including the risks, benefits and alternatives for the proposed anesthesia with the patient or authorized representative who has indicated his/her understanding and acceptance.  ? ? ? ?Dental advisory given ? ?Plan Discussed with: CRNA and Surgeon ? ?Anesthesia Plan Comments:   ? ? ? ? ? ?Anesthesia Quick Evaluation ? ?

## 2021-07-18 NOTE — H&P (Signed)
Gynecology History and Physical ? ? ?Selena Shepard is a 26 y.o. female G34 presenting for scheduled bilateral ovarian cystectomy, open approach for ovarian masses. ? ?She presented for a several-month history of dull pelvic pain and small ovarian cyst on ultrasound at an outside facility.  An interval ultrasound was repeated however the ovaries were not well-visualized and there was concern for larger masses. A CT scan was ordered but patient did not present until later. ? ?CT revealed Bilateral fat containing masses in both ovaries/adnexa, measuring up to 4.8 cm on the right and 7.4 cm on the left compatible dermoids/teratomas. ? ?She was counseled on these findings and recommendation for removal, also to help with ongoing dull pain.  Discussed the importance of ovarian preservation for hormonal and fertility considerations.  We discussed the method of cystectomy, and recommended an open approach given the size and complexity of the bilateral ovarian cysts.  Ovarian preservation will remain a priority, however patient also understands this cannot be certain. ? ?Patient was also encouraged and given the opportunity to seek second opinion. ? ? ?OB History   ?No obstetric history on file. ?  ? ?Past Medical History:  ?Diagnosis Date  ? Anxiety   ? Bilateral ovarian cysts   ? History of COVID-19 11/2020  ? fever, shakes, body aches, stuffy nosex 8 days all symptoms resolved  ? History of kidney stones 08/07/11  ? passed on own  ? ?Past Surgical History:  ?Procedure Laterality Date  ? NO PAST SURGERIES    ? ?Family History: family history includes Diabetes in her paternal grandmother; Heart disease in her paternal grandmother; Hypertension in her paternal grandfather and paternal grandmother; Stroke in her paternal grandfather. ?Social History:  reports that she has quit smoking. Her smoking use included cigarettes. She has never used smokeless tobacco. She reports current drug use. Drug: Marijuana. She reports that  she does not drink alcohol. ? ? ?Review of Systems - Patient denies fever, chills, SOB, CP, N/V/D.  ?History ?  ?Blood pressure (!) 135/91, pulse 98, temperature 98.7 ?F (37.1 ?C), temperature source Oral, resp. rate 20, height '5\' 3"'$  (1.6 m), weight 83 kg, last menstrual period 07/15/2021, SpO2 100 %. ?Exam ?Physical Exam  ? ?Gen: alert, well appearing, no distress ?Chest: nonlabored breathing ?CV: no peripheral edema ?Abdomen: soft, nontender ?Ext: no evidence of DVT ? ? ? 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 ? Discussed dermoid cysts in detail, including future risk of enlargement, change in ovarian function, ovarian torsion, rupture/peritonitis. ? Recommend removal. Given size, bilaterally, and goal for ovarian preservation, recommend open approach. ? Discussed procedure in detail, including risks which include but are not limited to bleeding, infection, damage to nearby organs including bowel, bladder, ureter, need to additional procedure, and inability to preserve ovaries. ? Also discussed immediate and long term postoperative recovery.  ? ?Selena Shepard ?07/19/2021, 7:21 AM ? ? ? ? ?

## 2021-07-19 ENCOUNTER — Ambulatory Visit (HOSPITAL_BASED_OUTPATIENT_CLINIC_OR_DEPARTMENT_OTHER): Payer: 59 | Admitting: Anesthesiology

## 2021-07-19 ENCOUNTER — Inpatient Hospital Stay: Payer: 59 | Admitting: Gynecologic Oncology

## 2021-07-19 ENCOUNTER — Encounter (HOSPITAL_BASED_OUTPATIENT_CLINIC_OR_DEPARTMENT_OTHER)
Admission: RE | Disposition: A | Discharge status: Home / Self Care | Payer: Self-pay | Source: Ambulatory Visit | Attending: Obstetrics and Gynecology

## 2021-07-19 ENCOUNTER — Observation Stay (HOSPITAL_BASED_OUTPATIENT_CLINIC_OR_DEPARTMENT_OTHER)
Admission: RE | Admit: 2021-07-19 | Discharge: 2021-07-20 | Disposition: A | Discharge status: Home / Self Care | Payer: 59 | Source: Ambulatory Visit | Attending: Obstetrics and Gynecology | Admitting: Obstetrics and Gynecology

## 2021-07-19 ENCOUNTER — Encounter (HOSPITAL_BASED_OUTPATIENT_CLINIC_OR_DEPARTMENT_OTHER): Payer: Self-pay | Admitting: Obstetrics and Gynecology

## 2021-07-19 ENCOUNTER — Other Ambulatory Visit: Payer: Self-pay

## 2021-07-19 DIAGNOSIS — D270 Benign neoplasm of right ovary: Secondary | ICD-10-CM | POA: Diagnosis not present

## 2021-07-19 DIAGNOSIS — N83202 Unspecified ovarian cyst, left side: Secondary | ICD-10-CM | POA: Diagnosis not present

## 2021-07-19 DIAGNOSIS — Z87891 Personal history of nicotine dependence: Secondary | ICD-10-CM | POA: Insufficient documentation

## 2021-07-19 DIAGNOSIS — N83201 Unspecified ovarian cyst, right side: Secondary | ICD-10-CM

## 2021-07-19 DIAGNOSIS — Z8616 Personal history of COVID-19: Secondary | ICD-10-CM | POA: Insufficient documentation

## 2021-07-19 DIAGNOSIS — D271 Benign neoplasm of left ovary: Secondary | ICD-10-CM | POA: Insufficient documentation

## 2021-07-19 HISTORY — PX: LAPAROTOMY: SHX154

## 2021-07-19 HISTORY — DX: Unspecified ovarian cyst, right side: N83.201

## 2021-07-19 HISTORY — DX: Anxiety disorder, unspecified: F41.9

## 2021-07-19 HISTORY — PX: LAPAROSCOPIC OVARIAN CYSTECTOMY: SHX6248

## 2021-07-19 LAB — TYPE AND SCREEN
ABO/RH(D): A POS
Antibody Screen: NEGATIVE

## 2021-07-19 LAB — POCT PREGNANCY, URINE: Preg Test, Ur: NEGATIVE

## 2021-07-19 SURGERY — EXCISION, CYST, OVARY, LAPAROSCOPIC
Anesthesia: General | Site: Abdomen

## 2021-07-19 MED ORDER — CEFAZOLIN SODIUM-DEXTROSE 2-4 GM/100ML-% IV SOLN
2.0000 g | INTRAVENOUS | Status: AC
  Administered 2021-07-19: 2 g via INTRAVENOUS

## 2021-07-19 MED ORDER — ONDANSETRON HCL 4 MG/2ML IJ SOLN
INTRAMUSCULAR | Status: DC | PRN
  Administered 2021-07-19: 4 mg via INTRAVENOUS

## 2021-07-19 MED ORDER — DEXTROSE-NACL 5-0.45 % IV SOLN: INTRAVENOUS | Status: DC

## 2021-07-19 MED ORDER — MIDAZOLAM HCL 2 MG/2ML IJ SOLN
INTRAMUSCULAR | Status: AC
  Filled 2021-07-19: qty 2

## 2021-07-19 MED ORDER — BUPIVACAINE LIPOSOME 1.3 % IJ SUSP
INTRAMUSCULAR | Status: DC | PRN
  Administered 2021-07-19: 30 mL

## 2021-07-19 MED ORDER — PROPOFOL 10 MG/ML IV BOLUS
INTRAVENOUS | Status: DC | PRN
  Administered 2021-07-19: 200 mg via INTRAVENOUS
  Administered 2021-07-19 (×3): 20 mg via INTRAVENOUS

## 2021-07-19 MED ORDER — SODIUM CHLORIDE 0.9 % IR SOLN
Status: DC | PRN
  Administered 2021-07-19: 4000 mL

## 2021-07-19 MED ORDER — DOCUSATE SODIUM 100 MG PO CAPS
ORAL_CAPSULE | ORAL | Status: AC
  Filled 2021-07-19: qty 1

## 2021-07-19 MED ORDER — SCOPOLAMINE 1 MG/3DAYS TD PT72
MEDICATED_PATCH | TRANSDERMAL | Status: AC
  Filled 2021-07-19: qty 1

## 2021-07-19 MED ORDER — SUGAMMADEX SODIUM 200 MG/2ML IV SOLN
INTRAVENOUS | Status: DC | PRN
  Administered 2021-07-19: 160 mg via INTRAVENOUS

## 2021-07-19 MED ORDER — PROPOFOL 1000 MG/100ML IV EMUL
INTRAVENOUS | Status: AC
  Filled 2021-07-19: qty 100

## 2021-07-19 MED ORDER — DEXAMETHASONE SODIUM PHOSPHATE 4 MG/ML IJ SOLN
INTRAMUSCULAR | Status: DC | PRN
  Administered 2021-07-19: 10 mg via INTRAVENOUS

## 2021-07-19 MED ORDER — ACETAMINOPHEN 500 MG PO TABS
ORAL_TABLET | ORAL | Status: AC
  Filled 2021-07-19: qty 2

## 2021-07-19 MED ORDER — ACETAMINOPHEN 500 MG PO TABS
1000.0000 mg | ORAL_TABLET | Freq: Once | ORAL | Status: AC
  Administered 2021-07-19: 1000 mg via ORAL

## 2021-07-19 MED ORDER — ALBUTEROL SULFATE HFA 108 (90 BASE) MCG/ACT IN AERS
INHALATION_SPRAY | RESPIRATORY_TRACT | Status: DC | PRN
  Administered 2021-07-19: 6 via RESPIRATORY_TRACT

## 2021-07-19 MED ORDER — ROCURONIUM BROMIDE 100 MG/10ML IV SOLN
INTRAVENOUS | Status: DC | PRN
  Administered 2021-07-19: 60 mg via INTRAVENOUS
  Administered 2021-07-19: 10 mg via INTRAVENOUS

## 2021-07-19 MED ORDER — OXYCODONE HCL 5 MG/5ML PO SOLN: 5.0000 mg | Freq: Once | ORAL | Status: DC | PRN

## 2021-07-19 MED ORDER — ACETAMINOPHEN 325 MG PO TABS: 650.0000 mg | ORAL_TABLET | ORAL | Status: DC | PRN

## 2021-07-19 MED ORDER — PROPOFOL 500 MG/50ML IV EMUL
INTRAVENOUS | Status: AC
  Filled 2021-07-19: qty 100

## 2021-07-19 MED ORDER — ZOLPIDEM TARTRATE 5 MG PO TABS: 5.0000 mg | ORAL_TABLET | Freq: Every evening | ORAL | Status: DC | PRN

## 2021-07-19 MED ORDER — SIMETHICONE 80 MG PO CHEW
CHEWABLE_TABLET | ORAL | Status: AC
  Filled 2021-07-19: qty 1

## 2021-07-19 MED ORDER — CEFAZOLIN SODIUM-DEXTROSE 2-4 GM/100ML-% IV SOLN
INTRAVENOUS | Status: AC
  Filled 2021-07-19: qty 100

## 2021-07-19 MED ORDER — OXYCODONE HCL 5 MG PO TABS
5.0000 mg | ORAL_TABLET | ORAL | Status: DC | PRN
  Administered 2021-07-19: 5 mg via ORAL

## 2021-07-19 MED ORDER — IBUPROFEN 200 MG PO TABS
600.0000 mg | ORAL_TABLET | Freq: Four times a day (QID) | ORAL | Status: DC
  Administered 2021-07-19 – 2021-07-20 (×3): 600 mg via ORAL

## 2021-07-19 MED ORDER — LIDOCAINE HCL (CARDIAC) PF 100 MG/5ML IV SOSY
PREFILLED_SYRINGE | INTRAVENOUS | Status: DC | PRN
  Administered 2021-07-19: 60 mg via INTRAVENOUS

## 2021-07-19 MED ORDER — FENTANYL CITRATE (PF) 250 MCG/5ML IJ SOLN
INTRAMUSCULAR | Status: AC
  Filled 2021-07-19: qty 5

## 2021-07-19 MED ORDER — ONDANSETRON HCL 4 MG/2ML IJ SOLN
4.0000 mg | Freq: Four times a day (QID) | INTRAMUSCULAR | Status: DC | PRN
Start: 2021-07-19 — End: 2021-07-20

## 2021-07-19 MED ORDER — OXYCODONE HCL 5 MG PO TABS
ORAL_TABLET | ORAL | Status: AC
  Filled 2021-07-19: qty 1

## 2021-07-19 MED ORDER — FENTANYL CITRATE (PF) 100 MCG/2ML IJ SOLN
INTRAMUSCULAR | Status: DC | PRN
  Administered 2021-07-19: 50 ug via INTRAVENOUS
  Administered 2021-07-19: 100 ug via INTRAVENOUS
  Administered 2021-07-19 (×3): 50 ug via INTRAVENOUS

## 2021-07-19 MED ORDER — POVIDONE-IODINE 10 % EX SWAB: 2.0000 "application " | Freq: Once | CUTANEOUS | Status: DC

## 2021-07-19 MED ORDER — TRAMADOL HCL 50 MG PO TABS
50.0000 mg | ORAL_TABLET | ORAL | Status: DC | PRN
  Administered 2021-07-19 – 2021-07-20 (×4): 50 mg via ORAL

## 2021-07-19 MED ORDER — MEPERIDINE HCL 25 MG/ML IJ SOLN: 6.2500 mg | INTRAMUSCULAR | Status: DC | PRN

## 2021-07-19 MED ORDER — TRAMADOL HCL 50 MG PO TABS
ORAL_TABLET | ORAL | Status: AC
  Filled 2021-07-19: qty 1

## 2021-07-19 MED ORDER — DEXMEDETOMIDINE (PRECEDEX) IN NS 20 MCG/5ML (4 MCG/ML) IV SYRINGE
PREFILLED_SYRINGE | INTRAVENOUS | Status: DC | PRN
  Administered 2021-07-19: 4 ug via INTRAVENOUS
  Administered 2021-07-19: 8 ug via INTRAVENOUS

## 2021-07-19 MED ORDER — HYDROMORPHONE HCL 1 MG/ML IJ SOLN
0.2500 mg | INTRAMUSCULAR | Status: DC | PRN
  Administered 2021-07-19 (×2): 0.5 mg via INTRAVENOUS

## 2021-07-19 MED ORDER — DOCUSATE SODIUM 100 MG PO CAPS
100.0000 mg | ORAL_CAPSULE | Freq: Two times a day (BID) | ORAL | Status: DC
  Administered 2021-07-19 (×2): 100 mg via ORAL

## 2021-07-19 MED ORDER — ONDANSETRON HCL 4 MG PO TABS: 4.0000 mg | ORAL_TABLET | Freq: Four times a day (QID) | ORAL | Status: DC | PRN

## 2021-07-19 MED ORDER — SCOPOLAMINE 1 MG/3DAYS TD PT72
1.0000 | MEDICATED_PATCH | TRANSDERMAL | Status: DC
  Administered 2021-07-19: 1.5 mg via TRANSDERMAL

## 2021-07-19 MED ORDER — TRAMADOL HCL 50 MG PO TABS
ORAL_TABLET | ORAL | Status: AC
Start: 2021-07-19 — End: ?
  Filled 2021-07-19: qty 1

## 2021-07-19 MED ORDER — KETOROLAC TROMETHAMINE 30 MG/ML IJ SOLN
INTRAMUSCULAR | Status: DC | PRN
  Administered 2021-07-19: 30 mg via INTRAVENOUS

## 2021-07-19 MED ORDER — SIMETHICONE 80 MG PO CHEW
80.0000 mg | CHEWABLE_TABLET | Freq: Four times a day (QID) | ORAL | Status: DC | PRN
  Administered 2021-07-19: 80 mg via ORAL

## 2021-07-19 MED ORDER — ALUM & MAG HYDROXIDE-SIMETH 200-200-20 MG/5ML PO SUSP: 30.0000 mL | ORAL | Status: DC | PRN

## 2021-07-19 MED ORDER — OXYCODONE HCL 5 MG PO TABS: 5.0000 mg | ORAL_TABLET | Freq: Once | ORAL | Status: DC | PRN

## 2021-07-19 MED ORDER — LACTATED RINGERS IV SOLN: INTRAVENOUS | Status: DC

## 2021-07-19 MED ORDER — IBUPROFEN 200 MG PO TABS
ORAL_TABLET | ORAL | Status: AC
  Filled 2021-07-19: qty 3

## 2021-07-19 MED ORDER — SODIUM CHLORIDE (PF) 0.9 % IJ SOLN
INTRAMUSCULAR | Status: DC | PRN
  Administered 2021-07-19: 20 mL via INTRAVENOUS

## 2021-07-19 MED ORDER — HYDROMORPHONE HCL 1 MG/ML IJ SOLN
INTRAMUSCULAR | Status: AC
  Filled 2021-07-19: qty 1

## 2021-07-19 MED ORDER — MENTHOL 3 MG MT LOZG: 1.0000 | LOZENGE | OROMUCOSAL | Status: DC | PRN

## 2021-07-19 MED ORDER — ALBUTEROL SULFATE HFA 108 (90 BASE) MCG/ACT IN AERS
INHALATION_SPRAY | RESPIRATORY_TRACT | Status: AC
  Filled 2021-07-19: qty 6.7

## 2021-07-19 MED ORDER — MIDAZOLAM HCL 2 MG/2ML IJ SOLN
INTRAMUSCULAR | Status: DC | PRN
  Administered 2021-07-19: 2 mg via INTRAVENOUS

## 2021-07-19 MED ORDER — MIDAZOLAM HCL 2 MG/2ML IJ SOLN: 0.5000 mg | Freq: Once | INTRAMUSCULAR | Status: DC | PRN

## 2021-07-19 SURGICAL SUPPLY — 61 items
ADH SKN CLS APL DERMABOND .7 (GAUZE/BANDAGES/DRESSINGS) ×2
APL SKNCLS STERI-STRIP NONHPOA (GAUZE/BANDAGES/DRESSINGS) ×2
BAG SPEC RTRVL LRG 6X4 10 (ENDOMECHANICALS)
BARRIER ADHS 3X4 INTERCEED (GAUZE/BANDAGES/DRESSINGS) IMPLANT
BENZOIN TINCTURE PRP APPL 2/3 (GAUZE/BANDAGES/DRESSINGS) ×1 IMPLANT
BRR ADH 4X3 ABS CNTRL BYND (GAUZE/BANDAGES/DRESSINGS)
CABLE HIGH FREQUENCY MONO STRZ (ELECTRODE) IMPLANT
CANISTER SUCT 1200ML W/VALVE (MISCELLANEOUS) ×3 IMPLANT
CATH ROBINSON RED A/P 16FR (CATHETERS) ×2 IMPLANT
CELLS DAT CNTRL 66122 CELL SVR (MISCELLANEOUS) ×2 IMPLANT
CLSR STERI-STRIP ANTIMIC 1/2X4 (GAUZE/BANDAGES/DRESSINGS) ×1 IMPLANT
COVER MAYO STAND STRL (DRAPES) ×3 IMPLANT
DECANTER SPIKE VIAL GLASS SM (MISCELLANEOUS) ×4 IMPLANT
DERMABOND ADVANCED (GAUZE/BANDAGES/DRESSINGS) ×1
DERMABOND ADVANCED .7 DNX12 (GAUZE/BANDAGES/DRESSINGS) ×2 IMPLANT
DRAPE WARM FLUID 44X44 (DRAPES) ×1 IMPLANT
DRSG OPSITE POSTOP 3X4 (GAUZE/BANDAGES/DRESSINGS) IMPLANT
DRSG OPSITE POSTOP 4X10 (GAUZE/BANDAGES/DRESSINGS) ×3 IMPLANT
DURAPREP 26ML APPLICATOR (WOUND CARE) ×3 IMPLANT
GAUZE 4X4 16PLY ~~LOC~~+RFID DBL (SPONGE) ×5 IMPLANT
GLOVE SURG LTX SZ7 (GLOVE) ×6 IMPLANT
GLOVE SURG UNDER POLY LF SZ7 (GLOVE) ×6 IMPLANT
GOWN STRL REUS W/ TWL LRG LVL3 (GOWN DISPOSABLE) ×4 IMPLANT
GOWN STRL REUS W/TWL LRG LVL3 (GOWN DISPOSABLE) ×15 IMPLANT
HIBICLENS CHG 4% 4OZ BTL (MISCELLANEOUS) ×3 IMPLANT
KIT TURNOVER CYSTO (KITS) ×3 IMPLANT
LIGASURE IMPACT 36 18CM CVD LR (INSTRUMENTS) IMPLANT
LIGASURE VESSEL 5MM BLUNT TIP (ELECTROSURGICAL) IMPLANT
NEEDLE HYPO 22GX1.5 SAFETY (NEEDLE) ×3 IMPLANT
NS IRRIG 1000ML POUR BTL (IV SOLUTION) ×5 IMPLANT
PACK ABDOMINAL GYN (CUSTOM PROCEDURE TRAY) ×3 IMPLANT
PACK LAPAROSCOPY BASIN (CUSTOM PROCEDURE TRAY) ×3 IMPLANT
PACK TRENDGUARD 450 HYBRID PRO (MISCELLANEOUS) IMPLANT
PAD ARMBOARD 7.5X6 YLW CONV (MISCELLANEOUS) ×3 IMPLANT
PAD OB MATERNITY 4.3X12.25 (PERSONAL CARE ITEMS) ×3 IMPLANT
PAD PREP 24X48 CUFFED NSTRL (MISCELLANEOUS) ×2 IMPLANT
POUCH SPECIMEN RETRIEVAL 10MM (ENDOMECHANICALS) IMPLANT
PROTECTOR NERVE ULNAR (MISCELLANEOUS) ×4 IMPLANT
RETRACTOR WND ALEXIS 18 MED (MISCELLANEOUS) IMPLANT
RTRCTR WOUND ALEXIS 18CM MED (MISCELLANEOUS) ×3
SET IRRIG TUBING LAPAROSCOPIC (IRRIGATION / IRRIGATOR) IMPLANT
SET TUBE SMOKE EVAC HIGH FLOW (TUBING) ×2 IMPLANT
SPONGE T-LAP 18X18 ~~LOC~~+RFID (SPONGE) ×3 IMPLANT
STRIP CLOSURE SKIN 1/2X4 (GAUZE/BANDAGES/DRESSINGS) ×1 IMPLANT
SUT MNCRL 0 MO-4 VIOLET 18 CR (SUTURE) ×6 IMPLANT
SUT MNCRL 0 VIOLET 6X18 (SUTURE) ×2 IMPLANT
SUT MNCRL AB 0 CT1 27 (SUTURE) ×3 IMPLANT
SUT MON AB 4-0 PS1 27 (SUTURE) ×2 IMPLANT
SUT MONOCRYL 0 6X18 (SUTURE) ×1
SUT MONOCRYL 0 MO 4 18  CR/8 (SUTURE)
SUT PDS AB 0 CTX 60 (SUTURE) ×6 IMPLANT
SUT PLAIN 2 0 XLH (SUTURE) ×1 IMPLANT
SUT VIC AB 4-0 KS 27 (SUTURE) ×1 IMPLANT
SUT VICRYL 0 UR6 27IN ABS (SUTURE) ×3 IMPLANT
SYR CONTROL 10ML LL (SYRINGE) ×1 IMPLANT
TOWEL OR 17X26 10 PK STRL BLUE (TOWEL DISPOSABLE) ×6 IMPLANT
TRAY FOLEY W/BAG SLVR 14FR (SET/KITS/TRAYS/PACK) ×3 IMPLANT
TRENDGUARD 450 HYBRID PRO PACK (MISCELLANEOUS)
TROCAR XCEL NON-BLD 11X100MML (ENDOMECHANICALS) ×2 IMPLANT
TROCAR XCEL NON-BLD 5MMX100MML (ENDOMECHANICALS) ×4 IMPLANT
WARMER LAPAROSCOPE (MISCELLANEOUS) ×2 IMPLANT

## 2021-07-19 NOTE — Anesthesia Procedure Notes (Signed)
Procedure Name: Intubation ?Date/Time: 07/19/2021 7:52 AM ?Performed by: Georgeanne Nim, CRNA ?Pre-anesthesia Checklist: Patient identified, Emergency Drugs available, Suction available, Patient being monitored and Timeout performed ?Patient Re-evaluated:Patient Re-evaluated prior to induction ?Oxygen Delivery Method: Circle system utilized ?Preoxygenation: Pre-oxygenation with 100% oxygen ?Induction Type: IV induction ?Ventilation: Mask ventilation without difficulty ?Laryngoscope Size: Mac and 4 ?Grade View: Grade I ?Tube type: Oral ?Tube size: 7.0 mm ?Number of attempts: 1 ?Airway Equipment and Method: Stylet ?Placement Confirmation: ETT inserted through vocal cords under direct vision, positive ETCO2, CO2 detector and breath sounds checked- equal and bilateral ?Secured at: 22 cm ?Tube secured with: Tape ?Dental Injury: Teeth and Oropharynx as per pre-operative assessment  ? ? ? ? ?

## 2021-07-19 NOTE — Op Note (Signed)
PREOPERATIVE DIAGNOSES: ?1. Bilateral ovarian cysts - suspect dermoid ? ?POSTOPERATIVE DIAGNOSES: ?Same ? ?PROCEDURE PERFORMED: Open bilateral ovarian cystectomy ? ?SURGEON: Dr. Alpha Gula ?ASSISTANT: Dr. Molli Posey ? ?ANESTHESIA: General  ? ?ESTIMATED BLOOD LOSS: 50 cc. ? ?URINE OUTPUT: clear urine at the end of the procedure. ? ?COMPLICATIONS: None  ? ?TUBES: None. ? ?DRAINS: Foley to gravity. ? ?PATHOLOGY: ovarian cysts and cyst contents ? ?FINDINGS: enlarged bilateral ovarian masses consistent with dermoid cysts, left > right.  Normal appearing pelvis otherwise ? ?Procedure: ?The patient was prepped and draped in the usual sterile manner for an abdominal procedure. A pfannenstiel incision was made  carried down to the underlying fascia. Fascia was incised in the midline and extended bilaterally with mayo scissors. Underlying rectus muscles were separated from the fascia superiorly and inferiorly in the usual fashion. Peritoneum was entered sharply with hemostat and metz with good visualization of the bladder and bowel. Once inside the abdominal cavity, a self-retaining retractor was placed Ubaldo Glassing) and the right ovary was brought up for manipulation.  An incision was made along the course of the cyst and with sharp and blunt dissection, cyst contents and wall were dissected away.  Careful attention was paid to spillage to minimize exposure into the abdominal cavity.  Hemostasis was achieved with selective bovie cautery.  Healthy ovarian tissue remained.  This process was repeated on the left side, which was larger and with multiple loculations.  Hemostasis was again noted.  Copious irrigation and cleaning was done and hemostasis again noted. Cyst contents included sebaceous material, hair, bone.  Given size and multiple loculations, dissection was moderately extensive.  ? ? The lap sponges were then removed and the self-retaining retractor was removed. The patient tolerated the operation nicely. There  were no complications associated with this surgical procedure to this point. The sponge count was correct times 2 at this time. The Foley catheter was inspected and clear urine was noted. Having removed all instruments and packs, we then began closure of the abdomen. The peritoneum was closed with #2-0 Vicryl in a running continuous manner. The fascia was closed with #0 PDS in a running continuous manner and the subcutaneous tissue was also closed with plain gut in a running continuous manner. Hemostasis was secured throughout the entire layers. The incision was then closed as noted in the above operative findings. The patient tolerated the operation nicely and was then taken to the Recovery Room in good condition.  ? ?Alpha Gula MD  ?

## 2021-07-19 NOTE — Transfer of Care (Signed)
Immediate Anesthesia Transfer of Care Note ? ?Patient: Selena Shepard ? ?Procedure(s) Performed: OPEN BILATERAL OVARIAN CYSTECTOMY (Bilateral: Abdomen) ?EXPLORATORY LAPAROTOMY (Abdomen) ? ?Patient Location: PACU ? ?Anesthesia Type:General ? ?Level of Consciousness: awake, alert , oriented and patient cooperative ? ?Airway & Oxygen Therapy: Patient Spontanous Breathing and Patient connected to nasal cannula oxygen ? ?Post-op Assessment: Report given to RN and Post -op Vital signs reviewed and stable ? ?Post vital signs: Reviewed and stable ? ?Last Vitals:  ?Vitals Value Taken Time  ?BP 131/98 07/19/21 0945  ?Temp    ?Pulse 111 07/19/21 0942  ?Resp 17 07/19/21 0945  ?SpO2 93 % 07/19/21 0942  ?Vitals shown include unvalidated device data. ? ?Last Pain:  ?Vitals:  ? 07/19/21 0610  ?TempSrc: Oral  ?PainSc: 0-No pain  ?   ? ?  ? ?Complications: No notable events documented. ?

## 2021-07-19 NOTE — Anesthesia Postprocedure Evaluation (Signed)
Anesthesia Post Note ? ?Patient: Selena Shepard ? ?Procedure(s) Performed: OPEN BILATERAL OVARIAN CYSTECTOMY (Bilateral: Abdomen) ?EXPLORATORY LAPAROTOMY (Abdomen) ? ?  ? ?Patient location during evaluation: PACU ?Anesthesia Type: General ?Level of consciousness: awake and alert, oriented and patient cooperative ?Pain management: pain level controlled ?Vital Signs Assessment: post-procedure vital signs reviewed and stable ?Respiratory status: spontaneous breathing, nonlabored ventilation and respiratory function stable ?Cardiovascular status: blood pressure returned to baseline and stable ?Postop Assessment: no apparent nausea or vomiting ?Anesthetic complications: no ? ? ?No notable events documented. ? ?Last Vitals:  ?Vitals:  ? 07/19/21 1045 07/19/21 1100  ?BP: 124/83   ?Pulse: 92 (!) 102  ?Resp: (!) 23 14  ?Temp:    ?SpO2: 98% 95%  ?  ?Last Pain:  ?Vitals:  ? 07/19/21 0610  ?TempSrc: Oral  ?PainSc: 0-No pain  ? ? ?  ?  ?  ?  ?  ?  ? ?Shilo Pauwels,E. Binh Doten ? ? ? ? ?

## 2021-07-20 ENCOUNTER — Encounter (HOSPITAL_BASED_OUTPATIENT_CLINIC_OR_DEPARTMENT_OTHER): Payer: Self-pay | Admitting: Obstetrics and Gynecology

## 2021-07-20 DIAGNOSIS — D270 Benign neoplasm of right ovary: Secondary | ICD-10-CM | POA: Diagnosis not present

## 2021-07-20 LAB — CBC
HCT: 36.8 % (ref 36.0–46.0)
Hemoglobin: 11.7 g/dL — ABNORMAL LOW (ref 12.0–15.0)
MCH: 27 pg (ref 26.0–34.0)
MCHC: 31.8 g/dL (ref 30.0–36.0)
MCV: 84.8 fL (ref 80.0–100.0)
Platelets: 356 10*3/uL (ref 150–400)
RBC: 4.34 MIL/uL (ref 3.87–5.11)
RDW: 13.4 % (ref 11.5–15.5)
WBC: 19.5 10*3/uL — ABNORMAL HIGH (ref 4.0–10.5)
nRBC: 0 % (ref 0.0–0.2)

## 2021-07-20 LAB — SURGICAL PATHOLOGY

## 2021-07-20 MED ORDER — TRAMADOL HCL 50 MG PO TABS
ORAL_TABLET | ORAL | Status: AC
  Filled 2021-07-20: qty 1

## 2021-07-20 MED ORDER — IBUPROFEN 200 MG PO TABS
ORAL_TABLET | ORAL | Status: AC
  Filled 2021-07-20: qty 3

## 2021-07-20 MED ORDER — IBUPROFEN 600 MG PO TABS: 600.0000 mg | ORAL_TABLET | Freq: Four times a day (QID) | ORAL | 0 refills | Status: DC

## 2021-07-20 MED ORDER — ACETAMINOPHEN 325 MG PO TABS: 650.0000 mg | ORAL_TABLET | ORAL | 0 refills | Status: DC | PRN

## 2021-07-20 MED ORDER — TRAMADOL HCL 50 MG PO TABS
50.0000 mg | ORAL_TABLET | ORAL | 0 refills | Status: DC | PRN
Start: 2021-07-20 — End: 2022-04-26

## 2021-07-20 MED ORDER — DOCUSATE SODIUM 100 MG PO CAPS: 100.0000 mg | ORAL_CAPSULE | Freq: Two times a day (BID) | ORAL | 0 refills | Status: DC

## 2021-07-20 NOTE — Discharge Summary (Signed)
Gynecology Discharge Summary ? ?Selena Shepard is a 26 y.o. female that presented on 07/19/2021 for bilateral ovarian cystectomy via open approach for bilateral enlarged ovarian cysts.  Her procedure was uncomplicated.  Her postoperative course was uncomplicated and on POD#1, she reported well controlled pain, spontaneous voiding, ambulating without difficulty, passing flatus, and tolerating PO.  She was stable for discharge home on 07/20/2021 with plans for in-office follow up. ? ?Hemoglobin  ?Date Value Ref Range Status  ?07/20/2021 11.7 (L) 12.0 - 15.0 g/dL Final  ? ?HCT  ?Date Value Ref Range Status  ?07/20/2021 36.8 36.0 - 46.0 % Final  ? ? ?Physical Exam:  ?General: alert and no distress ?Chest: nonlabored breathing ?Abdomen: soft, ATTP, nondistended ?Incision: dressing in place, healing well ?DVT Evaluation: No evidence of DVT seen on physical exam. ? ?Discharge Diagnoses: s/p bilateral ovarian cystectomy ? ?Discharge Information: ?Date: 07/20/2021 ?Activity: Pelvic rest, as tolerated, lifting restrictions ?Diet: routine ?Medications: Tylenol, motrin, tramadol, colace ?Condition: stable ?Instructions: Refer to practice specific booklet.  Discussed prior to discharge.  ?Discharge to: Home ? Follow-up Information   ? ? [facility] Follow up.   ?Why: Please follow up for postoperative visit. ?Contact information: ?[address] ?[place] [address] ?[phone] ? ? ?  ?  ? ?  ?  ? ?  ? ? ? ?Carlyon Shadow ?07/20/2021, 2:19 PM ? ?

## 2021-07-20 NOTE — Progress Notes (Signed)
Gynecology Progress Note ? ?Postoperative day 1 s/p open bilateral ovarian cystectomy ? ?Subjective: ? ?Patient reports no overnight events.  She reports well controlled pain. She had some itching with oxycodone but is tolerating tramadol better. She is ambulating without difficulty, voiding spontaneously, tolerating PO.  She reports Positive flatus, Negative BM. ? ?Objective: ?Blood pressure 117/87, pulse 87, temperature (!) 97.5 ?F (36.4 ?C), resp. rate 18, height '5\' 3"'$  (1.6 m), weight 83 kg, last menstrual period 07/15/2021, SpO2 95 %. ? ?Physical Exam:  ?General: alert and no distress ?Lochia: appropriate ?Abdomen: soft, ATTP, nondistended ?Incision: dressing in place ?DVT Evaluation: No evidence of DVT seen on physical exam. ? ?Recent Labs  ?  07/20/21 ?0108  ?HGB 11.7*  ?HCT 36.8  ? ? ?Assessment/Plan: ?Postoperative day #1, doing well ?Procedure and findings reviewed. Recovery discussed ?Doing well, precautions reviewed.   Plan for in-office follow up and discharge today.  ? ? LOS: 0 days  ? ?Carlyon Shadow ?07/20/2021, 7:38 AM  ? ? ?

## 2022-03-21 DIAGNOSIS — Z13220 Encounter for screening for lipoid disorders: Secondary | ICD-10-CM | POA: Diagnosis not present

## 2022-03-21 DIAGNOSIS — Z1321 Encounter for screening for nutritional disorder: Secondary | ICD-10-CM | POA: Diagnosis not present

## 2022-03-21 DIAGNOSIS — Z13228 Encounter for screening for other metabolic disorders: Secondary | ICD-10-CM | POA: Diagnosis not present

## 2022-03-21 DIAGNOSIS — Z124 Encounter for screening for malignant neoplasm of cervix: Secondary | ICD-10-CM | POA: Diagnosis not present

## 2022-03-21 DIAGNOSIS — Z131 Encounter for screening for diabetes mellitus: Secondary | ICD-10-CM | POA: Diagnosis not present

## 2022-03-21 DIAGNOSIS — Z6833 Body mass index (BMI) 33.0-33.9, adult: Secondary | ICD-10-CM | POA: Diagnosis not present

## 2022-03-21 DIAGNOSIS — Z130 Encounter for screening for diseases of the blood and blood-forming organs and certain disorders involving the immune mechanism: Secondary | ICD-10-CM | POA: Diagnosis not present

## 2022-03-21 DIAGNOSIS — Z01419 Encounter for gynecological examination (general) (routine) without abnormal findings: Secondary | ICD-10-CM | POA: Diagnosis not present

## 2022-03-21 DIAGNOSIS — Z1329 Encounter for screening for other suspected endocrine disorder: Secondary | ICD-10-CM | POA: Diagnosis not present

## 2022-04-06 ENCOUNTER — Encounter (INDEPENDENT_AMBULATORY_CARE_PROVIDER_SITE_OTHER): Payer: BLUE CROSS/BLUE SHIELD | Admitting: Family Medicine

## 2022-04-26 ENCOUNTER — Encounter: Payer: Self-pay | Admitting: Family Medicine

## 2022-04-26 ENCOUNTER — Ambulatory Visit (INDEPENDENT_AMBULATORY_CARE_PROVIDER_SITE_OTHER): Payer: 59 | Admitting: Family Medicine

## 2022-04-26 VITALS — BP 122/82 | HR 90 | Temp 98.1°F | Resp 18 | Ht 63.0 in | Wt 190.6 lb

## 2022-04-26 DIAGNOSIS — H6992 Unspecified Eustachian tube disorder, left ear: Secondary | ICD-10-CM | POA: Diagnosis not present

## 2022-04-26 DIAGNOSIS — E669 Obesity, unspecified: Secondary | ICD-10-CM | POA: Diagnosis not present

## 2022-04-26 DIAGNOSIS — E782 Mixed hyperlipidemia: Secondary | ICD-10-CM | POA: Diagnosis not present

## 2022-04-26 DIAGNOSIS — E559 Vitamin D deficiency, unspecified: Secondary | ICD-10-CM

## 2022-04-26 MED ORDER — SEMAGLUTIDE-WEIGHT MANAGEMENT 1 MG/0.5ML ~~LOC~~ SOAJ: 1.0000 mg | SUBCUTANEOUS | 0 refills | Status: DC

## 2022-04-26 MED ORDER — SEMAGLUTIDE-WEIGHT MANAGEMENT 1.7 MG/0.75ML ~~LOC~~ SOAJ: 1.7000 mg | SUBCUTANEOUS | 0 refills | Status: DC

## 2022-04-26 MED ORDER — SEMAGLUTIDE-WEIGHT MANAGEMENT 0.5 MG/0.5ML ~~LOC~~ SOAJ: 0.5000 mg | SUBCUTANEOUS | 0 refills | Status: DC

## 2022-04-26 MED ORDER — SEMAGLUTIDE-WEIGHT MANAGEMENT 0.25 MG/0.5ML ~~LOC~~ SOAJ: 0.2500 mg | SUBCUTANEOUS | 0 refills | Status: DC

## 2022-04-26 MED ORDER — SEMAGLUTIDE-WEIGHT MANAGEMENT 2.4 MG/0.75ML ~~LOC~~ SOAJ: 2.4000 mg | SUBCUTANEOUS | 0 refills | Status: DC

## 2022-04-26 NOTE — Patient Instructions (Addendum)
Keep the diet clean and stay active.  Aim to do some physical exertion for 150 minutes per week. This is typically divided into 5 days per week, 30 minutes per day. The activity should be enough to get your heart rate up. Anything is better than nothing if you have time constraints.  Consider weight resistance exercise.  OK to use Debrox (peroxide) in the ear to loosen up wax. Also recommend using a bulb syringe (for removing boogers from baby's noses) to flush through warm water and vinegar (3-4:1 ratio). An alternative, though more expensive, is an elephant ear washer wax removal kit. Do not use Q-tips as this can impact wax further.  Claritin (loratadine), Allegra (fexofenadine), Zyrtec (cetirizine) which is also equivalent to Xyzal (levocetirizine); these are listed in order from weakest to strongest. Generic, and therefore cheaper, options are in the parentheses.   Flonase (fluticasone); nasal spray that is over the counter. 2 sprays each nostril, once daily. Aim towards the same side eye when you spray.  There are available OTC, and the generic versions, which may be cheaper, are in parentheses. Show this to a pharmacist if you have trouble finding any of these items.  Please consider counseling. Contact [phone] to schedule an appointment or inquire about cost/insurance coverage.  [facility] located at [place], [place], [place].  Phone number = [phone].  Dr. Lennice Sites - Adult Psychiatry.    [facility] located at [place], [place], [place]. Phone number = [phone].   [facility] located at [address] [place], [place].  Phone number = [phone].   [facility] located at [place], [place], [place].  Phone number = [phone].  Let us know if you need anything.

## 2022-04-26 NOTE — Progress Notes (Signed)
Chief Complaint  Patient presents with   New Patient (Initial Visit)    Pt states went to GYN and had labs done and was told her cholesterol was high.        New Patient Visit SUBJECTIVE: HPI: Selena Shepard is an 26 y.o.female who is being seen for establishing care.  She is here with her spouse.  The patient was previously seen at GYN.  The patient recently had blood work that showed a vitamin D of around 23 and an LDL of 144.  Triglycerides were in the 160s.  She is a family history of high cholesterol but no personal history of this.  She is not on any medication for this.  Diet could be better and she stress eats.  She walks relatively routinely.  No chest pain or shortness of breath.  She has never taken any medication to help her lose weight but is interested in doing so.  She has no current interest in having children but does want to once her weight is more stable.  Diet/exercise as above.  Past Medical History:  Diagnosis Date   Anxiety    History of kidney stones 07-23-2011   passed on own   Past Surgical History:  Procedure Laterality Date   LAPAROSCOPIC OVARIAN CYSTECTOMY Bilateral 07/19/2021   Procedure: OPEN BILATERAL OVARIAN CYSTECTOMY;  Surgeon: Carlyon Shadow, MD;  Location: [place];  Service: Gynecology;  Laterality: Bilateral;   LAPAROTOMY N/A 07/19/2021   Procedure: EXPLORATORY LAPAROTOMY;  Surgeon: Carlyon Shadow, MD;  Location: [facility];  Service: Gynecology;  Laterality: N/A;   Family History  Problem Relation Age of Onset   Asthma Brother    Diabetes Paternal Grandmother    Heart disease Paternal Grandmother    Hypertension Paternal Grandmother    Stroke Paternal Grandfather    Hypertension Paternal Grandfather    Allergies  Allergen Reactions   Oxycodone Itching    Current Outpatient Medications:    Cholecalciferol (VITAMIN D) 50 MCG (2000 UT) CAPS, Take by mouth. 2 or 3 x week, Disp: , Rfl:     Multiple Vitamins-Minerals (EMERGEN-C IMMUNE) PACK, Take by mouth. 1 pack daily, Disp: , Rfl:    OVER THE COUNTER MEDICATION, Marijuana thc edible Jun 03, 2021 last dose for sleep, Disp: , Rfl:    Probiotic Product (PROBIOTIC PO), Take by mouth. Provelin  1 tablet 20 billion csu 2 to 3 x week, Disp: , Rfl:    Semaglutide-Weight Management 0.25 MG/0.5ML SOAJ, Inject 0.25 mg into the skin once a week for 28 days., Disp: 2 mL, Rfl: 0   [START ON 05/25/2022] Semaglutide-Weight Management 0.5 MG/0.5ML SOAJ, Inject 0.5 mg into the skin once a week for 28 days., Disp: 2 mL, Rfl: 0   [START ON 06/23/2022] Semaglutide-Weight Management 1 MG/0.5ML SOAJ, Inject 1 mg into the skin once a week for 28 days., Disp: 2 mL, Rfl: 0   [START ON 07/22/2022] Semaglutide-Weight Management 1.7 MG/0.75ML SOAJ, Inject 1.7 mg into the skin once a week for 28 days., Disp: 3 mL, Rfl: 0   [START ON 08/20/2022] Semaglutide-Weight Management 2.4 MG/0.75ML SOAJ, Inject 2.4 mg into the skin once a week for 28 days., Disp: 3 mL, Rfl: 0   zinc gluconate 50 MG tablet, Take 50 mg by mouth. 2 or 3 x week, Disp: , Rfl:   OBJECTIVE: BP 122/82 (BP Location: Left Arm, Cuff Size: Large)   Pulse 90   Temp 98.1 F (36.7 C) (  Oral)   Resp 18   Ht '5\' 3"'$  (1.6 m)   Wt 190 lb 9.6 oz (86.5 kg)   LMP 04/15/2022   SpO2 96%   BMI 33.76 kg/m  General:  well developed, well nourished, in no apparent distress Skin:  no significant moles, warts, or growths Lungs:  clear to auscultation, breath sounds equal bilaterally, no respiratory distress Cardio:  regular rate and rhythm, no LE edema or bruits Neuro:  gait normal Psych: well oriented with normal range of affect and appropriate judgment/insight  ASSESSMENT/PLAN: Vitamin D insufficiency  Mixed hyperlipidemia - Plan: Lipid panel  Dysfunction of left eustachian tube  Obesity (BMI 30-39.9) - Plan: Semaglutide-Weight Management 0.25 MG/0.5ML SOAJ, Semaglutide-Weight Management 0.5 MG/0.5ML SOAJ,  Semaglutide-Weight Management 1 MG/0.5ML SOAJ, Semaglutide-Weight Management 1.7 MG/0.75ML SOAJ, Semaglutide-Weight Management 2.4 MG/0.75ML SOAJ  Recommended to take a different over-the-counter formulation of vitamin D, at least 1000 units daily. We will have her come back in 1 month to recheck obesity and recheck her labs.  That way she will be fasting and a little bit further from the holidays. Flonase recommended.  Over-the-counter anti-histamines recommended that she does not like nasal sprays. Chronic, uncontrolled.  Start Wegovy 0.25 mg weekly and subsequently increase until highest tolerated dosage or 2.4 mg weekly.  Counseled on diet and exercise.  Follow-up in 1 month to see how she is doing.  She was warned about potential stock issues in addition to cost issues.  She will let me know if she runs into this.  She is willing to start phentermine.  She was told not to get pregnant on these medications. The patient voiced understanding and agreement to the plan.   [place], DO 04/26/22  2:42 PM

## 2022-04-28 ENCOUNTER — Telehealth: Payer: Self-pay

## 2022-04-28 ENCOUNTER — Encounter: Payer: Self-pay | Admitting: Family Medicine

## 2022-04-28 ENCOUNTER — Other Ambulatory Visit: Payer: Self-pay | Admitting: Family Medicine

## 2022-04-28 MED ORDER — TIRZEPATIDE-WEIGHT MANAGEMENT 5 MG/0.5ML ~~LOC~~ SOAJ: 5.0000 mg | SUBCUTANEOUS | 0 refills | Status: DC

## 2022-04-28 MED ORDER — TIRZEPATIDE-WEIGHT MANAGEMENT 2.5 MG/0.5ML ~~LOC~~ SOAJ: 2.5000 mg | SUBCUTANEOUS | 0 refills | Status: AC

## 2022-04-28 MED ORDER — TIRZEPATIDE-WEIGHT MANAGEMENT 7.5 MG/0.5ML ~~LOC~~ SOAJ: 7.5000 mg | SUBCUTANEOUS | 0 refills | Status: DC

## 2022-04-28 NOTE — Telephone Encounter (Signed)
PA initiated via Covermymeds; KEY: [id number]. Awaiting determination.

## 2022-04-29 NOTE — Telephone Encounter (Signed)
PA denied. Plan exclusion.  

## 2022-05-27 ENCOUNTER — Telehealth (INDEPENDENT_AMBULATORY_CARE_PROVIDER_SITE_OTHER): Payer: 59 | Admitting: Family Medicine

## 2022-05-27 ENCOUNTER — Encounter: Payer: Self-pay | Admitting: Family Medicine

## 2022-05-27 DIAGNOSIS — E669 Obesity, unspecified: Secondary | ICD-10-CM | POA: Diagnosis not present

## 2022-05-27 MED ORDER — PHENTERMINE HCL 37.5 MG PO CAPS: 37.5000 mg | ORAL_CAPSULE | ORAL | 0 refills | Status: DC

## 2022-05-27 NOTE — Progress Notes (Signed)
Chief Complaint  Patient presents with   Follow-up    Subjective: Patient is a 27 y.o. female here for f/u. Due to COVID-19 pandemic, we are interacting via web portal for an electronic face-to-face visit. I verified patient's ID using 2 identifiers. Patient agreed to proceed with visit via this method. Patient is at home, I am at office. Patient and I are present for visit.   Patient was seen about a month ago and his abdominal sent in.  Unfortunately her insurance did not cover it.  She has a history of stomach issues and did not want to start [facility] as many of her family members had abdominal issues with that.  Diet is fair.  She has never been on phentermine before.  She is interested in going on some type of medication.  Past Medical History:  Diagnosis Date   Anxiety    History of kidney stones 2011/07/25   passed on own    Objective: No conversational dyspnea Age appropriate judgment and insight Nml affect and mood   Assessment and Plan: Obesity (BMI 30-39.9) - Plan: phentermine 37.5 MG capsule  Chronic, uncontrolled.  Start phentermine 37.5 mg daily.  Discussed potential side effects.  Follow-up in 1 month to recheck. The patient voiced understanding and agreement to the plan.  [place], DO 05/27/22  2:15 PM

## 2022-06-03 ENCOUNTER — Telehealth: Payer: Self-pay | Admitting: Family Medicine

## 2022-06-03 ENCOUNTER — Other Ambulatory Visit: Payer: Self-pay | Admitting: Family Medicine

## 2022-06-03 DIAGNOSIS — E782 Mixed hyperlipidemia: Secondary | ICD-10-CM

## 2022-06-03 DIAGNOSIS — E559 Vitamin D deficiency, unspecified: Secondary | ICD-10-CM

## 2022-06-03 NOTE — Telephone Encounter (Signed)
Patient said she was supposed to come in for labs but everyone in her house got sick and she couldn't make it. I do not see what labs she is referring to but advised I would send a message. Please call patient to advise.

## 2022-06-03 NOTE — Telephone Encounter (Signed)
Labs ordered Appt. made

## 2022-06-08 ENCOUNTER — Other Ambulatory Visit (INDEPENDENT_AMBULATORY_CARE_PROVIDER_SITE_OTHER): Payer: 59

## 2022-06-08 DIAGNOSIS — E559 Vitamin D deficiency, unspecified: Secondary | ICD-10-CM | POA: Diagnosis not present

## 2022-06-08 DIAGNOSIS — E782 Mixed hyperlipidemia: Secondary | ICD-10-CM | POA: Diagnosis not present

## 2022-06-09 ENCOUNTER — Other Ambulatory Visit: Payer: Self-pay | Admitting: Family Medicine

## 2022-06-09 LAB — LIPID PANEL
Cholesterol: 179 mg/dL (ref 0–200)
HDL: 32.9 mg/dL — ABNORMAL LOW (ref >39.00)
LDL Cholesterol: 129 mg/dL — ABNORMAL HIGH (ref 0–99)
NonHDL: 146.2
Total CHOL/HDL Ratio: 5
Triglycerides: 87 mg/dL (ref 0.0–149.0)
VLDL: 17.4 mg/dL (ref 0.0–40.0)

## 2022-06-09 LAB — VITAMIN D 25 HYDROXY (VIT D DEFICIENCY, FRACTURES): VITD: 18.19 ng/mL — ABNORMAL LOW (ref 30.00–100.00)

## 2022-06-09 MED ORDER — VITAMIN D (ERGOCALCIFEROL) 1.25 MG (50000 UNIT) PO CAPS: 50000.0000 [IU] | ORAL_CAPSULE | ORAL | 0 refills | Status: DC

## 2022-06-10 ENCOUNTER — Other Ambulatory Visit: Payer: Self-pay | Admitting: Family Medicine

## 2022-06-10 DIAGNOSIS — E559 Vitamin D deficiency, unspecified: Secondary | ICD-10-CM

## 2022-06-13 ENCOUNTER — Encounter: Payer: Self-pay | Admitting: Family Medicine

## 2022-07-01 ENCOUNTER — Encounter: Payer: Self-pay | Admitting: Family Medicine

## 2022-07-01 ENCOUNTER — Ambulatory Visit (INDEPENDENT_AMBULATORY_CARE_PROVIDER_SITE_OTHER): Payer: 59 | Admitting: Family Medicine

## 2022-07-01 VITALS — BP 110/70 | HR 90 | Temp 98.1°F | Ht 63.0 in | Wt 175.2 lb

## 2022-07-01 DIAGNOSIS — M25531 Pain in right wrist: Secondary | ICD-10-CM

## 2022-07-01 DIAGNOSIS — E669 Obesity, unspecified: Secondary | ICD-10-CM

## 2022-07-01 NOTE — Patient Instructions (Addendum)
Keep the diet clean and stay active.  Aim to do some physical exertion for 150 minutes per week. This is typically divided into 5 days per week, 30 minutes per day. The activity should be enough to get your heart rate up. Anything is better than nothing if you have time constraints.  Consider adding routine weight resistance exercise.   Aim for 65-70 g of protein daily.   Send me a message if you change your mind about starting a medication or see the weight loss clinic.  Heat (pad or rice pillow in microwave) over affected area, 10-15 minutes twice daily.   Ice/cold pack over area for 10-15 min twice daily.  OK to take Tylenol 1000 mg (2 extra strength tabs) or 975 mg (3 regular strength tabs) every 6 hours as needed.  Let us know if you need anything.  Wrist and Forearm Exercises Do exercises exactly as told by your health care provider and adjust them as directed. It is normal to feel mild stretching, pulling, tightness, or discomfort as you do these exercises, but you should stop right away if you feel sudden pain or your pain gets worse.   RANGE OF MOTION EXERCISES These exercises warm up your muscles and joints and improve the movement and flexibility of your injured wrist and forearm. These exercises also help to relieve pain, numbness, and tingling. These exercises are done using the muscles in your injured wrist and forearm. Exercise A: Wrist Flexion, Active With your fingers relaxed, bend your wrist forward as far as you can. Hold this position for 30 seconds. Repeat 2 times. Complete this exercise 3 times per week. Exercise B: Wrist Extension, Active With your fingers relaxed, bend your wrist backward as far as you can. Hold this position for 30 seconds. Repeat 2 times. Complete this exercise 3 times per week. Exercise C: Supination, Active  Stand or sit with your arms at your sides. Bend your left / right elbow to an "L" shape (90 degrees). Turn your palm upward until  you feel a gentle stretch on the inside of your forearm. Hold this position for 30 seconds. Slowly return your palm to the starting position. Repeat 2 times. Complete this exercise 3 times per week. Exercise D: Pronation, Active  Stand or sit with your arms at your sides. Bend your left / right elbow to an "L" shape (90 degrees). Turn your palm downward until you feel a gentle stretch on the top of your forearm. Hold this position for 30 seconds. Slowly return your palm to the starting position. Repeat 2 times. Complete this exercise once a day.  STRETCHING EXERCISES These exercises warm up your muscles and joints and improve the movement and flexibility of your injured wrist and forearm. These exercises also help to relieve pain, numbness, and tingling. These exercises are done using your healthy wrist and forearm to help stretch the muscles in your injured wrist and forearm. Exercise E: Wrist Flexion, Passive  Extend your left / right arm in front of you, relax your wrist, and point your fingers downward. Gently push on the back of your hand. Stop when you feel a gentle stretch on the top of your forearm. Hold this position for 30 seconds. Repeat 2 times. Complete this exercise 3 times per week. Exercise F: Wrist Extension, Passive  Extend your left / right arm in front of you and turn your palm upward. Gently pull your palm and fingertips back so your fingers point downward. You should feel a  gentle stretch on the palm-side of your forearm. Hold this position for 30 seconds. Repeat 2 times. Complete this exercise 3 times per week. Exercise G: Forearm Rotation, Supination, Passive Sit with your left / right elbow bent to an "L" shape (90 degrees) with your forearm resting on a table. Keeping your upper body and shoulder still, use your other hand to rotate your forearm palm-up until you feel a gentle to moderate stretch. Hold this position for 30 seconds. Slowly release the stretch  and return to the starting position. Repeat 2 times. Complete this exercise 3 times per week. Exercise H: Forearm Rotation, Pronation, Passive Sit with your left / right elbow bent to an "L" shape (90 degrees) with your forearm resting on a table. Keeping your upper body and shoulder still, use your other hand to rotate your forearm palm-down until you feel a gentle to moderate stretch. Hold this position for 30 seconds. Slowly release the stretch and return to the starting position. Repeat 2 times. Complete this exercise 3 times per week.  STRENGTHENING EXERCISES These exercises build strength and endurance in your wrist and forearm. Endurance is the ability to use your muscles for a long time, even after they get tired. Exercise I: Wrist Flexors  Sit with your left / right forearm supported on a table and your hand resting palm-up over the edge of the table. Your elbow should be bent to an "L" shape (about 90 degrees) and be below the level of your shoulder. Hold a 3-5 lb weight in your left / right hand. Or, hold a rubber exercise band or tube in both hands, keeping your hands at the same level and hip distance apart. There should be a slight tension in the exercise band or tube. Slowly curl your hand up toward your forearm. Hold this position for 3 seconds. Slowly lower your hand back to the starting position. Repeat 2 times. Complete this exercise 3 times per week. Exercise J: Wrist Extensors  Sit with your left / right forearm supported on a table and your hand resting palm-down over the edge of the table. Your elbow should be bent to an "L" shape (about 90 degrees) and be below the level of your shoulder. Hold a 3-5 lb weight in your left / right hand. Or, hold a rubber exercise band or tube in both hands, keeping your hands at the same level and hip distance apart. There should be a slight tension in the exercise band or tube. Slowly curl your hand up toward your forearm. Hold this  position for 3 seconds. Slowly lower your hand back to the starting position. Repeat 2 times. Complete this exercise 3 times per week. Exercise K: Forearm Rotation, Supination  Sit with your left / right forearm supported on a table and your hand resting palm-down. Your elbow should be at your side, bent to an "L" shape (about 90 degrees), and below the level of your shoulder. Keep your wrist stable and in a neutral position throughout the exercise. Gently hold a lightweight hammer with your left / right hand. Without moving your elbow or wrist, slowly rotate your palm upward to a thumbs-up position. Hold this position for 3 seconds. Slowly return your forearm to the starting position. Repeat 2 times. Complete this exercise 3 times per week. Exercise L: Forearm Rotation, Pronation  Sit with your left / right forearm supported on a table and your hand resting palm-up. Your elbow should be at your side, bent to an "L"  shape (about 90 degrees), and below the level of your shoulder. Keep your wrist stable. Do not allow it to move backward or forward during the exercise. Gently hold a lightweight hammer with your left / right hand. Without moving your elbow or wrist, slowly rotate your palm and hand upward to a thumbs-up position. Hold this position for 3 seconds. Slowly return your forearm to the starting position. Repeat 2 times. Complete this exercise 3 times per week. Exercise M: Grip Strengthening  Hold one of these items in your left / right hand: play dough, therapy putty, a dense sponge, a stress ball, or a large, rolled sock. Squeeze as hard as you can without increasing pain. Hold this position for 5 seconds. Slowly release your grip. Repeat 2 times. Complete this exercise 3 times per week.  This information is not intended to replace advice given to you by your health care provider. Make sure you discuss any questions you have with your health care provider. Document Released:  03/02/2005 Document Revised: 01/11/2016 Document Reviewed: 01/11/2015 Elsevier Interactive Patient Education  [employer].

## 2022-07-01 NOTE — Progress Notes (Signed)
Chief Complaint  Patient presents with   Follow-up    One month Phentermine Did not work well for her     Subjective: Patient is a 27 y.o. female here for f/u.  She is here with her spouse.  She started phentermine 37.5 mg daily 1 month ago.  She reports compliance.  It made her more irritable and exacerbate depression following cycle.  She lost around 15 pounds as it did curb her appetite.  Depression also reduced her appetite.  She does not want to continue this.  Diet is okay.  She is walking for exercise.  2 weeks ago, she started having pain on the back of her wrist radiating down her hands into her fingers.  No specific injury or change in activity.  There is no swelling, redness, or bruising.  She has not tried anything at home so far.  Past Medical History:  Diagnosis Date   Anxiety    History of kidney stones 07/27/2011   passed on own    Objective: BP 110/70 (BP Location: Left Arm, Patient Position: Sitting, Cuff Size: Normal)   Pulse 90   Temp 98.1 F (36.7 C) (Oral)   Ht '5\' 3"'$  (1.6 m)   Wt 175 lb 4 oz (79.5 kg)   SpO2 94%   BMI 31.04 kg/m  General: Awake, appears stated age Heart: RRR, no LE edema Lungs: CTAB, no rales, wheezes or rhonchi. No accessory muscle use MSK: No external deformity, normal active and passive range of motion.  Mild TTP over the extensor tendons of the right wrist.  I do not appreciate any edema. Psych: Age appropriate judgment and insight, normal affect and mood  Assessment and Plan: Obesity (BMI 30-39.9)  Right wrist pain  We discussed starting Wellbutrin or referring [facility] medical [facility] loss [facility].  She prefers to continue with no medication or referral at this time.  Counseled on exercise/weight resistance exercise.  She will try to aim for around 65-70 g of protein daily. Stretches and exercises provided, wrist brace to wear at home at nighttime or during aggravating activities.  Send a message in a few weeks if no improvement  and we will consider occupational therapy versus referral to sports medicine. I will see her in 6 months for physical or as needed. The patient and her spouse's voiced understanding and agreement to the plan.  [place], DO 07/01/22  4:23 PM

## 2022-09-16 ENCOUNTER — Other Ambulatory Visit (INDEPENDENT_AMBULATORY_CARE_PROVIDER_SITE_OTHER): Payer: 59

## 2022-09-16 DIAGNOSIS — E559 Vitamin D deficiency, unspecified: Secondary | ICD-10-CM

## 2022-09-16 NOTE — Addendum Note (Signed)
Addended by: Rosita Kea on: 09/16/2022 02:44 PM   Modules accepted: Orders

## 2022-09-17 ENCOUNTER — Other Ambulatory Visit: Payer: Self-pay | Admitting: Family Medicine

## 2022-09-17 LAB — VITAMIN D 25 HYDROXY (VIT D DEFICIENCY, FRACTURES): Vit D, 25-Hydroxy: 48 ng/mL (ref 30–100)

## 2022-10-10 ENCOUNTER — Ambulatory Visit (INDEPENDENT_AMBULATORY_CARE_PROVIDER_SITE_OTHER): Payer: 59 | Admitting: Family Medicine

## 2022-10-10 ENCOUNTER — Encounter: Payer: Self-pay | Admitting: Family Medicine

## 2022-10-10 VITALS — BP 102/78 | HR 81 | Temp 98.4°F | Ht 63.0 in | Wt 170.0 lb

## 2022-10-10 DIAGNOSIS — N3000 Acute cystitis without hematuria: Secondary | ICD-10-CM

## 2022-10-10 LAB — POC URINALSYSI DIPSTICK (AUTOMATED)
Bilirubin, UA: NEGATIVE
Blood, UA: NEGATIVE
Glucose, UA: NEGATIVE
Ketones, UA: NEGATIVE
Nitrite, UA: NEGATIVE
Protein, UA: NEGATIVE
Spec Grav, UA: 1.02 (ref 1.010–1.025)
Urobilinogen, UA: 0.2 E.U./dL
pH, UA: 5 (ref 5.0–8.0)

## 2022-10-10 MED ORDER — SULFAMETHOXAZOLE-TRIMETHOPRIM 800-160 MG PO TABS: 1.0000 | ORAL_TABLET | Freq: Two times a day (BID) | ORAL | 0 refills | Status: AC

## 2022-10-10 NOTE — Progress Notes (Signed)
Chief Complaint  Patient presents with   Hematuria   Dysuria    Selena Shepard is a 27 y.o. female here for possible UTI.  Duration: 4 days. Symptoms: Dysuria, urinary frequency, hematuria, urinary hesitancy, urinary retention, and urgency Denies: fever, nausea, vomiting, flank pain, vaginal discharge Hx of recurrent UTI? No Denies new sexual partners.  Past Medical History:  Diagnosis Date   Anxiety    History of kidney stones 2011/11/03   passed on own     BP 102/78 (BP Location: Left Arm, Patient Position: Sitting, Cuff Size: Normal)   Pulse 81   Temp 98.4 F (36.9 C) (Oral)   Ht 5\' 3"  (1.6 m)   Wt 170 lb (77.1 kg)   SpO2 99%   BMI 30.11 kg/m  General: Awake, alert, appears stated age Heart: RRR Lungs: CTAB, normal respiratory effort, no accessory muscle usage Abd: BS+, soft, NT, ND, no masses or organomegaly MSK: No CVA tenderness, neg Lloyd's sign Psych: Age appropriate judgment and insight  Acute cystitis without hematuria - Plan: sulfamethoxazole-trimethoprim (BACTRIM DS) 800-160 MG tablet  UA w LE. Given s/s's, will initiate tx. She is not pregnant nor has plans to. Cx.  Stay hydrated. Seek immediate care if pt starts to develop fevers, new/worsening symptoms, uncontrollable N/V. F/u prn. The patient voiced understanding and agreement to the plan.  Jilda Roche [place], DO 10/10/22 1:43 PM

## 2022-10-10 NOTE — Patient Instructions (Signed)
Stay hydrated.   Warning signs/symptoms: Uncontrollable nausea/vomiting, fevers, worsening symptoms despite treatment, confusion.  Give us around 2 business days to get culture back to you.  Let us know if you need anything. 

## 2022-10-11 LAB — URINE CULTURE
MICRO NUMBER:: 15062124
SPECIMEN QUALITY:: ADEQUATE

## 2022-11-09 ENCOUNTER — Ambulatory Visit (INDEPENDENT_AMBULATORY_CARE_PROVIDER_SITE_OTHER): Payer: 59 | Admitting: Family Medicine

## 2022-11-09 ENCOUNTER — Encounter: Payer: Self-pay | Admitting: Family Medicine

## 2022-11-09 VITALS — BP 110/78 | HR 84 | Temp 98.0°F | Ht 63.0 in | Wt 166.4 lb

## 2022-11-09 DIAGNOSIS — J069 Acute upper respiratory infection, unspecified: Secondary | ICD-10-CM

## 2022-11-09 LAB — POC COVID19 BINAXNOW: SARS Coronavirus 2 Ag: NEGATIVE

## 2022-11-09 MED ORDER — METHYLPREDNISOLONE ACETATE 80 MG/ML IJ SUSP
80.0000 mg | Freq: Once | INTRAMUSCULAR | Status: AC
  Administered 2022-11-09: 80 mg via INTRAMUSCULAR

## 2022-11-09 MED ORDER — PROMETHAZINE-DM 6.25-15 MG/5ML PO SYRP: 5.0000 mL | ORAL_SOLUTION | Freq: Four times a day (QID) | ORAL | 0 refills | Status: DC | PRN

## 2022-11-09 MED ORDER — BENZONATATE 200 MG PO CAPS: 200.0000 mg | ORAL_CAPSULE | Freq: Two times a day (BID) | ORAL | 0 refills | Status: DC | PRN

## 2022-11-09 NOTE — Patient Instructions (Addendum)
Continue to push fluids, practice good hand hygiene, and cover your mouth if you cough.  If you start having fevers, shaking or shortness of breath, seek immediate care.  OK to take Tylenol 1000 mg (2 extra strength tabs) or 975 mg (3 regular strength tabs) every 6 hours as needed.  Send me a message in a couple days if no better.   Let us know if you need anything.  

## 2022-11-09 NOTE — Progress Notes (Signed)
Chief Complaint  Patient presents with   Sore Throat    Fever Headache Fatigue Cough started this morning Has been sick for 3 days.     Noreene Larsson here for URI complaints.  Duration: 3 days  Associated symptoms: subjective fever, sinus headache, sinus congestion, sinus pain, sore throat, myalgia, and coughing Denies: rhinorrhea, itchy watery eyes, ear pain, ear drainage, wheezing, shortness of breath, and N/V/D Treatment to date: Dayquil/Nyquil Sick contacts: Yes; family members  Past Medical History:  Diagnosis Date   Anxiety    History of kidney stones 11-29-11   passed on own   Objective BP 110/78 (BP Location: Left Arm, Patient Position: Sitting, Cuff Size: Normal)   Pulse 84   Temp 98 F (36.7 C) (Oral)   Ht 5\' 3"  (1.6 m)   Wt 166 lb 6 oz (75.5 kg)   SpO2 98%   BMI 29.47 kg/m  General: Awake, alert, appears stated age HEENT: AT, [place], ears patent b/l and TM's neg, nares patent w/o discharge, pharynx pink and without exudates, MMM, mild ttp over max sinuses Neck: No masses or asymmetry Heart: RRR Lungs: CTAB, no accessory muscle use Psych: Age appropriate judgment and insight, normal mood and affect  Viral URI with cough - Plan: POC COVID-19, promethazine-dextromethorphan (PROMETHAZINE-DM) 6.25-15 MG/5ML syrup, benzonatate (TESSALON) 200 MG capsule  Covid test neg. Syrup and Perles as above. Warned about drowsiness w syrup. Send message in a couple days if on better. Rec'd investing in thermometer. Continue to push fluids, practice good hand hygiene, cover mouth when coughing. F/u prn. If starting to experience fevers, shaking, or shortness of breath, seek immediate care. Pt voiced understanding and agreement to the plan.  Jilda Roche [place], DO 11/09/22 2:18 PM

## 2022-11-11 ENCOUNTER — Other Ambulatory Visit: Payer: Self-pay | Admitting: Family Medicine

## 2022-11-11 ENCOUNTER — Encounter: Payer: Self-pay | Admitting: Family Medicine

## 2022-11-11 MED ORDER — DOXYCYCLINE HYCLATE 100 MG PO TABS: 100.0000 mg | ORAL_TABLET | Freq: Two times a day (BID) | ORAL | 0 refills | Status: DC

## 2022-12-02 ENCOUNTER — Encounter: Payer: Self-pay | Admitting: Family Medicine

## 2022-12-02 ENCOUNTER — Ambulatory Visit (INDEPENDENT_AMBULATORY_CARE_PROVIDER_SITE_OTHER): Payer: 59 | Admitting: Family Medicine

## 2022-12-02 VITALS — BP 110/68 | HR 50 | Temp 98.3°F | Ht 63.0 in | Wt 165.0 lb

## 2022-12-02 DIAGNOSIS — L680 Hirsutism: Secondary | ICD-10-CM | POA: Diagnosis not present

## 2022-12-02 DIAGNOSIS — M542 Cervicalgia: Secondary | ICD-10-CM

## 2022-12-02 DIAGNOSIS — L659 Nonscarring hair loss, unspecified: Secondary | ICD-10-CM

## 2022-12-02 DIAGNOSIS — M25561 Pain in right knee: Secondary | ICD-10-CM

## 2022-12-02 LAB — IBC + FERRITIN
Ferritin: 38 ng/mL (ref 10.0–291.0)
Iron: 105 ug/dL (ref 42–145)
Saturation Ratios: 27.3 % (ref 20.0–50.0)
TIBC: 385 ug/dL (ref 250.0–450.0)
Transferrin: 275 mg/dL (ref 212.0–360.0)

## 2022-12-02 NOTE — Progress Notes (Signed)
Chief Complaint  Patient presents with   Alopecia   Knee Problem    Right knee    Selena Shepard is a 27 y.o. female here for hair thinning.  Duration: 2 years Thinning of hair without bald patches Location: Scalp anteriorly Pruritic? No Painful? No New hair products? No Other associated symptoms: She is not pulling her hair excessively tight. Mom and dad have the head of hair addition to her paternal grandfather.  Her mother is adopted. Unsure of her protein intake.  No recent significant dietary change. Of note, she does have hair under her chin. Therapies tried thus far: Biotin, keratin  R knee pain Right under the knee cap, hurts when she steps down on stair stepper. Started 2 weeks ago when she started using it. No bruising, redness, swelling, catching/locking. Tried ibuprofen/Tylenol at home. No neuro s/s's.   Neck pain B/l neck pain for several weeks. Unsure if it is bc of her pillows or how she is sleeping. No inj or change in activity.  No decreased range of motion.  Past Medical History:  Diagnosis Date   Anxiety    History of kidney stones 01-08-2012   passed on own    BP 110/68 (BP Location: Left Arm, Patient Position: Sitting, Cuff Size: Normal)   Pulse (!) 50   Temp 98.3 F (36.8 C) (Oral)   Ht 5\' 3"  (1.6 m)   Wt 165 lb (74.8 kg)   SpO2 99%   BMI 29.23 kg/m  Gen: awake, alert, appearing stated age Lungs: No accessory muscle use Skin: Thinning of hair noted over the anterior portion of her scalp.  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  No drainage, erythema, TTP, fluctuance, excoriation MSK: Mild TTP over the medial and lateral anterior joint line and patellar tendon; no effusion, ecchymosis, edema, erythema; negative Lachman's, varus/valgus stress, Stines, McMurray's, patellar apprehension/grind Neck: Negative Spurling's, TTP over the suboccipital triangle, cervical paraspinal musculature, trapezius musculature bilaterally with hypertonicity.  No  edema, erythema or ecchymosis, normal active and passive range of motion Psych: Age appropriate judgment and insight  Hair thinning - Plan: IBC + Ferritin, T4, free, TSH, CANCELED: IBC + Ferritin, CANCELED: TSH, CANCELED: T4, free  Hirsutism  Acute pain of right knee  Neck pain  Check above labs.  Make sure protein intake is sound.  Avoid caustic substances in the hair. 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  If above labs and protein intake adjustments are unsuccessful, she will let me know and we will start spironolactone. Suspect a combination of patellar tendinitis and perhaps patellofemoral syndrome.  Stretches and exercises provided.  Ice, heat, Tylenol.  Consider sports medicine if no improvement the next 3 to 4 weeks. Suspect combination of suboccipital triangle musculature, cervical paraspinal musculature, and trapezius musculature involvement on both sides.  She has lots of stress.  Stretches and exercises, heat, ice, Tylenol.  Physical therapy if no better. F/u prn. The patient voiced understanding and agreement to the plan.  I spent 32 min with the pt discussing the above plans in addition to reviewing her chart on the same day of the visit.   Jilda Roche [place], DO 12/02/22 1:53 PM

## 2022-12-02 NOTE — Patient Instructions (Addendum)
Give [place] 2-3 business days to get the results of your labs back.   Make sure you are getting 65-70 g of protein in daily.   Heat (pad or rice pillow in microwave) over affected area, 10-15 minutes twice daily.   Ice/cold pack over area for 10-15 min twice daily.  OK to take Tylenol 1000 mg (2 extra strength tabs) or 975 mg (3 regular strength tabs) every 6 hours as needed.  Ibuprofen 400-600 mg (2-3 over the counter strength tabs) every 6 hours as needed for pain.  Send me a message at the end of the month if the hair isn't coming back with higher protein intake or if the knee isn't improving.   Let us know if you need anything.  Patellar Tendinitis Rehab Ask your health care provider which exercises are safe for you. Do exercises exactly as told by your health care provider and adjust them as directed. It is normal to feel mild stretching, pulling, tightness, or discomfort as you do these exercises, but you should stop right away if you feel sudden pain or your pain gets worse. Do not begin these exercises until told by your health care provider. Stretching and range of motion exercises This exercise warms up your muscles and joints and improves the movement and flexibility of your knee. This exercise also helps to relieve pain and stiffness. Exercise A: Hamstring, doorway  Lie on your back in front of a doorway with your R leg resting against the wall and your other leg flat on the floor in the doorway. There should be a slight bend in your knee. Straighten your knee. You should feel a stretch behind your knee or thigh. If you do not, scoot your buttocks closer to the door. Hold this position for 30 seconds. Repeat2 times. Complete this stretch 3 times per week. Strengthening exercises These exercises build strength and endurance in your knee. Endurance is the ability to use your muscles for a long time, even after they get tired. Exercise B: Quadriceps, isometric  Lie on your back  with your R leg extended and your other knee bent. Slowly tense the muscles in the front of your  thigh. When you do this, you should see your kneecap slide up toward your hip or see increased dimpling just above the knee. This motion will push the back of your knee toward the floor. If this is painful, try putting a rolled-up hand towel under your knee to support it in a bent position. Change the size of the towel to find a position that allows you to do this exercise without any pain. For 3 seconds, hold the muscle as tight as you can without increasing your pain. Relax the muscles slowly and completely. Repeat 2 times. Complete this exercise 3 times per week. Exercise C: Straight leg raises ( quadriceps) Lie on your back with your R leg extended and your other knee bent. Tense the muscles in the front of your R thigh. When you do this, you should see your kneecap slide up or see increased dimpling just above the knee. Keep these muscles tight as you raise your leg 4-6 inches (10-15 cm) off the floor. Do not let your moving knee bend. Hold this position for 1 second. Keep these muscles tense as you slowly lower your leg. Relax your muscles slowly and completely. Repeat 2 times. Complete this exercise 3 times per week Exercise D: Squats Stand in front of a table, with your feet and knees pointing  straight ahead. You may rest your hands on the table for balance but not for support. Slowly bend your knees and lower your hips like you are going to sit in a chair. Keep your weight over your heels, not over your toes. Keep your lower legs upright so they are parallel with the table legs. Do not let your hips go lower than your knees. Do not bend lower than told by your health care provider. If your knee pain increases, do not bend as low. Hold the squat position for 1 seconds. Slowly push with your legs to return to standing. Do not use your hands to pull yourself to standing. Repeat 2 times.  Complete this exercise 3 times per week. Exercise E: Step-downs Stand on the edge of a step. Keeping your weight over your R heel, slowly bend your R knee to bring your R heel toward the floor. Lower your heel as far as you can while keeping control and without increasing any discomfort. Do not let your knee come forward. Use your leg muscles, not gravity, to lower your body. Hold a wall or rail for balance if needed. Slowly push through your heel to lift your body weight back up. Return to the starting position. Repeat2 times. Complete this exercise 3 times per week. Exercise F: Straight leg raises ( hip abductors) Lie on your side with your R leg in the top position. Lie so your head, shoulder, knee, and hip line up. You may bend your lower knee to help you keep your balance. Roll your hips slightly forward, so that your hips are stacked directly over each other and your R knee is facing forward. Leading with your heel, lift your top leg 4-6 inches (10-15 cm). You should feel the muscles in your outer hip lifting. Do not let your foot drift forward. Do not let your knee roll toward the ceiling. Hold this position for 3 seconds. Slowly lower your leg to the starting position. Let your muscles relax completely after each repetition. Repeat 2 times. Complete this exercise 3 times per week. This information is not intended to replace advice given to you by your health care provider. Make sure you discuss any questions you have with your health care provider. Document Released: 04/18/2005 Document Revised: 12/24/2015 Document Reviewed: 01/20/2015 [employer] Interactive Patient Education  2018 [employer]  EXERCISES RANGE OF MOTION (ROM) AND STRETCHING EXERCISES  These exercises may help you when beginning to rehabilitate your issue. In order to successfully resolve your symptoms, you must improve your posture. These exercises are designed to help reduce the forward-head and rounded-shoulder  posture which contributes to this condition. Your symptoms may resolve with or without further involvement from your physician, physical therapist or athletic trainer. While completing these exercises, remember:  Restoring tissue flexibility helps normal motion to return to the joints. This allows healthier, less painful movement and activity. An effective stretch should be held for at least 20 seconds, although you may need to begin with shorter hold times for comfort. A stretch should never be painful. You should only feel a gentle lengthening or release in the stretched tissue. Do not do any stretch or exercise that you cannot tolerate.  STRETCH- Axial Extensors Lie on your back on the floor. You may bend your knees for comfort. Place a rolled-up hand towel or dish towel, about 2 inches in diameter, under the part of your head that makes contact with the floor. Gently tuck your chin, as if trying to make  a "double chin," until you feel a gentle stretch at the base of your head. Hold 15-20 seconds. Repeat 2-3 times. Complete this exercise 1 time per day.   STRETCH - Axial Extension  Stand or sit on a firm surface. Assume a good posture: chest up, shoulders drawn back, abdominal muscles slightly tense, knees unlocked (if standing) and feet hip width apart. Slowly retract your chin so your head slides back and your chin slightly lowers. Continue to look straight ahead. You should feel a gentle stretch in the back of your head. Be certain not to feel an aggressive stretch since this can cause headaches later. Hold for 15-20 seconds. Repeat 2-3 times. Complete this exercise 1 time per day.  STRETCH - Cervical Side Bend  Stand or sit on a firm surface. Assume a good posture: chest up, shoulders drawn back, abdominal muscles slightly tense, knees unlocked (if standing) and feet hip width apart. Without letting your nose or shoulders move, slowly tip your right / left ear to your shoulder until your  feel a gentle stretch in the muscles on the opposite side of your neck. Hold 15-20 seconds. Repeat 2-3 times. Complete this exercise 1-2 times per day.  STRETCH - Cervical Rotators  Stand or sit on a firm surface. Assume a good posture: chest up, shoulders drawn back, abdominal muscles slightly tense, knees unlocked (if standing) and feet hip width apart. Keeping your eyes level with the ground, slowly turn your head until you feel a gentle stretch along the back and opposite side of your neck. Hold 15-20 seconds. Repeat 2-3 times. Complete this exercise 1-2 times per day.  RANGE OF MOTION - Neck Circles  Stand or sit on a firm surface. Assume a good posture: chest up, shoulders drawn back, abdominal muscles slightly tense, knees unlocked (if standing) and feet hip width apart. Gently roll your head down and around from the back of one shoulder to the back of the other. The motion should never be forced or painful. Repeat the motion 10-20 times, or until you feel the neck muscles relax and loosen. Repeat 2-3 times. Complete the exercise 1-2 times per day. STRENGTHENING EXERCISES - Cervical Strain and Sprain These exercises may help you when beginning to rehabilitate your injury. They may resolve your symptoms with or without further involvement from your physician, physical therapist, or athletic trainer. While completing these exercises, remember:  Muscles can gain both the endurance and the strength needed for everyday activities through controlled exercises. Complete these exercises as instructed by your physician, physical therapist, or athletic trainer. Progress the resistance and repetitions only as guided. You may experience muscle soreness or fatigue, but the pain or discomfort you are trying to eliminate should never worsen during these exercises. If this pain does worsen, stop and make certain you are following the directions exactly. If the pain is still present after adjustments,  discontinue the exercise until you can discuss the trouble with your clinician.  STRENGTH - Cervical Flexors, Isometric Face a wall, standing about 6 inches away. Place a small pillow, a ball about 6-8 inches in diameter, or a folded towel between your forehead and the wall. Slightly tuck your chin and gently push your forehead into the soft object. Push only with mild to moderate intensity, building up tension gradually. Keep your jaw and forehead relaxed. Hold 10 to 20 seconds. Keep your breathing relaxed. Release the tension slowly. Relax your neck muscles completely before you start the next repetition. Repeat 2-3 times. Complete  this exercise 1 time per day.  STRENGTH- Cervical Lateral Flexors, Isometric  Stand about 6 inches away from a wall. Place a small pillow, a ball about 6-8 inches in diameter, or a folded towel between the side of your head and the wall. Slightly tuck your chin and gently tilt your head into the soft object. Push only with mild to moderate intensity, building up tension gradually. Keep your jaw and forehead relaxed. Hold 10 to 20 seconds. Keep your breathing relaxed. Release the tension slowly. Relax your neck muscles completely before you start the next repetition. Repeat 2-3 times. Complete this exercise 1 time per day.  STRENGTH - Cervical Extensors, Isometric  Stand about 6 inches away from a wall. Place a small pillow, a ball about 6-8 inches in diameter, or a folded towel between the back of your head and the wall. Slightly tuck your chin and gently tilt your head back into the soft object. Push only with mild to moderate intensity, building up tension gradually. Keep your jaw and forehead relaxed. Hold 10 to 20 seconds. Keep your breathing relaxed. Release the tension slowly. Relax your neck muscles completely before you start the next repetition. Repeat 2-3 times. Complete this exercise 1 time per day.  POSTURE AND BODY MECHANICS CONSIDERATIONS Keeping  correct posture when sitting, standing or completing your activities will reduce the stress put on different body tissues, allowing injured tissues a chance to heal and limiting painful experiences. The following are general guidelines for improved posture. Your physician or physical therapist will provide you with any instructions specific to your needs. While reading these guidelines, remember: The exercises prescribed by your provider will help you have the flexibility and strength to maintain correct postures. The correct posture provides the optimal environment for your joints to work. All of your joints have less wear and tear when properly supported by a spine with good posture. This means you will experience a healthier, less painful body. Correct posture must be practiced with all of your activities, especially prolonged sitting and standing. Correct posture is as important when doing repetitive low-stress activities (typing) as it is when doing a single heavy-load activity (lifting).  PROLONGED STANDING WHILE SLIGHTLY LEANING FORWARD When completing a task that requires you to lean forward while standing in one place for a long time, place either foot up on a stationary 2- to 4-inch high object to help maintain the best posture. When both feet are on the ground, the low back tends to lose its slight inward curve. If this curve flattens (or becomes too large), then the back and your other joints will experience too much stress, fatigue more quickly, and can cause pain.   RESTING POSITIONS Consider which positions are most painful for you when choosing a resting position. If you have pain with flexion-based activities (sitting, bending, stooping, squatting), choose a position that allows you to rest in a less flexed posture. You would want to avoid curling into a fetal position on your side. If your pain worsens with extension-based activities (prolonged standing, working overhead), avoid resting in  an extended position such as sleeping on your stomach. Most people will find more comfort when they rest with their spine in a more neutral position, neither too rounded nor too arched. Lying on a non-sagging bed on your side with a pillow between your knees, or on your back with a pillow under your knees will often provide some relief. Keep in mind, being in any one position for a  prolonged period of time, no matter how correct your posture, can still lead to stiffness.  WALKING Walk with an upright posture. Your ears, shoulders, and hips should all line up. OFFICE WORK When working at a desk, create an environment that supports good, upright posture. Without extra support, muscles fatigue and lead to excessive strain on joints and other tissues.  CHAIR: A chair should be able to slide under your desk when your back makes contact with the back of the chair. This allows you to work closely. The chair's height should allow your eyes to be level with the upper part of your monitor and your hands to be slightly lower than your elbows. Body position: Your feet should make contact with the floor. If this is not possible, use a foot rest. Keep your ears over your shoulders. This will reduce stress on your neck and low back.

## 2022-12-06 ENCOUNTER — Ambulatory Visit: Payer: 59 | Admitting: Family Medicine

## 2022-12-15 ENCOUNTER — Encounter (INDEPENDENT_AMBULATORY_CARE_PROVIDER_SITE_OTHER): Payer: Self-pay

## 2023-01-03 ENCOUNTER — Encounter: Payer: Self-pay | Admitting: Family Medicine

## 2023-01-03 ENCOUNTER — Ambulatory Visit (INDEPENDENT_AMBULATORY_CARE_PROVIDER_SITE_OTHER): Payer: 59 | Admitting: Family Medicine

## 2023-01-03 VITALS — BP 120/80 | HR 75 | Temp 98.2°F | Ht 63.0 in | Wt 163.4 lb

## 2023-01-03 DIAGNOSIS — N926 Irregular menstruation, unspecified: Secondary | ICD-10-CM

## 2023-01-03 DIAGNOSIS — Z1159 Encounter for screening for other viral diseases: Secondary | ICD-10-CM | POA: Diagnosis not present

## 2023-01-03 DIAGNOSIS — Z114 Encounter for screening for human immunodeficiency virus [HIV]: Secondary | ICD-10-CM | POA: Diagnosis not present

## 2023-01-03 DIAGNOSIS — Z0000 Encounter for general adult medical examination without abnormal findings: Secondary | ICD-10-CM

## 2023-01-03 DIAGNOSIS — R790 Abnormal level of blood mineral: Secondary | ICD-10-CM

## 2023-01-03 NOTE — Patient Instructions (Addendum)
Give [place] 2-3 business days to get the results of your labs back.   Keep the diet clean and stay active.  Please let us know or go to the pharmacy to get your tetanus booster.   Please get me a copy of your advanced directive form at your convenience.   Try 2 tablespoons of milk of mag in 4 oz of warm prune juice. Do that and wait a couple hours. If no improvement, try a Dulcolax suppository and then let me know if we are still having issues.   I recommend getting the flu shot in mid October. This suggestion would change if the CDC comes out with a different recommendation.   Claritin (loratadine), Allegra (fexofenadine), Zyrtec (cetirizine) which is also equivalent to Xyzal (levocetirizine); these are listed in order from weakest to strongest. Generic, and therefore cheaper, options are in the parentheses.   Flonase (fluticasone); nasal spray that is over the counter. 2 sprays each nostril, once daily. Aim towards the same side eye when you spray.  There are available OTC, and the generic versions, which may be cheaper, are in parentheses. Show this to a pharmacist if you have trouble finding any of these items.  Go back on the stepper and let me know if you have issues.   Iron Rich foods:  Red meat Prunes Spinach Liver Cream of Wheat  Moderate sources: Iron-fortified cereals (bran, etc) Almonds Beans/peas Pork Lamb Ham Scallops [place] Peaches Peanuts  Let us know if you need anything.

## 2023-01-03 NOTE — Progress Notes (Signed)
Chief Complaint  Patient presents with   Annual Exam    Congested Cycle is 13 days late     Well Woman Selena Shepard is here for a complete physical.   Her last physical was >1 year ago.  Current diet: in general, a "healthy" diet- not good lately. Current exercise: limited. Fatigue out of ordinary? No Seatbelt? Yes Advanced directive? No  Health Maintenance Pap/HPV- Yes Tetanus- Due HIV screening- No Hep C screening- No  Past Medical History:  Diagnosis Date   Anxiety    History of kidney stones February 07, 2012   passed on own     Past Surgical History:  Procedure Laterality Date   LAPAROSCOPIC OVARIAN CYSTECTOMY Bilateral 07/19/2021   Procedure: OPEN BILATERAL OVARIAN CYSTECTOMY;  Surgeon: Selena Henri, MD;  Location: [facility] ;  Service: Gynecology;  Laterality: Bilateral;   LAPAROTOMY N/A 07/19/2021   Procedure: EXPLORATORY LAPAROTOMY;  Surgeon: Selena Henri, MD;  Location: [facility];  Service: Gynecology;  Laterality: N/A;    Medications  Current Outpatient Medications on File Prior to Visit  Medication Sig Dispense Refill   OVER THE COUNTER MEDICATION Marijuana thc edible Jun 03, 2021 last dose for sleep     Allergies Allergies  Allergen Reactions   Oxycodone Itching    Review of Systems: Constitutional:  no unexpected weight changes Eye:  no recent significant change in vision Ear/Nose/Mouth/Throat:  Ears:  no tinnitus or vertigo and no recent change in hearing Nose/Mouth/Throat:  no complaints of nasal congestion, no sore throat Cardiovascular: no chest pain Respiratory:  no cough and no shortness of breath Gastrointestinal:  no abdominal pain, no change in bowel habits GU:  Female: negative for dysuria or pelvic pain Musculoskeletal/Extremities:  no pain of the joints Integumentary (Skin/Breast):  no abnormal skin lesions reported Neurologic:  no headaches Endocrine:  denies fatigue Hematologic/Lymphatic:   No areas of easy bleeding  Exam BP 120/80 (BP Location: Left Arm, Patient Position: Sitting, Cuff Size: Normal)   Pulse 75   Temp 98.2 F (36.8 C) (Oral)   Ht 5\' 3"  (1.6 m)   Wt 163 lb 6 oz (74.1 kg)   SpO2 95%   BMI 28.94 kg/m  General:  well developed, well nourished, in no apparent distress Skin:  no significant moles, warts, or growths Head:  no masses, lesions, or tenderness Eyes:  pupils equal and round, sclera anicteric without injection Ears:  canals without lesions, TMs shiny without retraction, no obvious effusion, no erythema Nose:  nares patent, turbinates edematous, worse on L, mucosa normal, and no drainage  Throat/Pharynx:  lips and gingiva without lesion; tongue and uvula midline; non-inflamed pharynx; no exudates or postnasal drainage Neck: neck supple without adenopathy, thyromegaly, or masses Lungs:  clear to auscultation, breath sounds equal bilaterally, no respiratory distress Cardio:  regular rate and rhythm, no bruits, no LE edema Abdomen:  abdomen soft, nontender; bowel sounds normal; no masses or organomegaly Genital: Defer to GYN Musculoskeletal:  symmetrical muscle groups noted without atrophy or deformity Extremities:  no clubbing, cyanosis, or edema, no deformities, no skin discoloration Neuro:  gait normal; deep tendon reflexes normal and symmetric Psych: well oriented with normal range of affect and appropriate judgment/insight  Assessment and Plan  Well adult exam - Plan: CBC, Comprehensive metabolic panel, Lipid panel  Low ferritin - Plan: IBC + Ferritin  Screening for HIV without presence of risk factors - Plan: HIV Antibody (routine testing w rflx)  Encounter for hepatitis C screening  test for low risk patient - Plan: Hepatitis C antibody  Missed period - Plan: B-HCG Quant   Well 27 y.o. female. Counseled on diet and exercise. Tdap offered, declined.  Ck preg tests. Advanced directive form provided today.  Ck HIV/Hep C. Fe rich food  list provided. Consider heme referral if not helping and ferritin still low.  Needs to try stepper again to see if things really improved w knee.  Cont INCS, add PO antihistamine.  Other orders as above. Follow up in 1 yr. The patient voiced understanding and agreement to the plan.  Jilda Roche [place], DO 01/03/23 2:45 PM

## 2023-01-04 LAB — CBC
HCT: 40.1 % (ref 36.0–46.0)
Hemoglobin: 12.6 g/dL (ref 12.0–15.0)
MCHC: 31.4 g/dL (ref 30.0–36.0)
MCV: 83.8 fl (ref 78.0–100.0)
Platelets: 241 10*3/uL (ref 150.0–400.0)
RBC: 4.79 Mil/uL (ref 3.87–5.11)
RDW: 14.7 % (ref 11.5–15.5)
WBC: 9.4 10*3/uL (ref 4.0–10.5)

## 2023-01-04 LAB — IBC + FERRITIN
Ferritin: 51.4 ng/mL (ref 10.0–291.0)
Iron: 35 ug/dL — ABNORMAL LOW (ref 42–145)
Saturation Ratios: 8.7 % — ABNORMAL LOW (ref 20.0–50.0)
TIBC: 400.4 ug/dL (ref 250.0–450.0)
Transferrin: 286 mg/dL (ref 212.0–360.0)

## 2023-01-04 LAB — HCG, QUANTITATIVE, PREGNANCY: Quantitative HCG: <0.6 m[IU]/mL

## 2023-01-04 LAB — COMPREHENSIVE METABOLIC PANEL
ALT: 12 U/L (ref 0–35)
AST: 10 U/L (ref 0–37)
Albumin: 4.3 g/dL (ref 3.5–5.2)
Alkaline Phosphatase: 77 U/L (ref 39–117)
BUN: 8 mg/dL (ref 6–23)
CO2: 28 meq/L (ref 19–32)
Calcium: 9.3 mg/dL (ref 8.4–10.5)
Chloride: 102 meq/L (ref 96–112)
Creatinine, Ser: 0.45 mg/dL (ref 0.40–1.20)
GFR: 130.63 mL/min (ref >60.00)
Glucose, Bld: 77 mg/dL (ref 70–99)
Potassium: 4.1 meq/L (ref 3.5–5.1)
Sodium: 139 meq/L (ref 135–145)
Total Bilirubin: 0.3 mg/dL (ref 0.2–1.2)
Total Protein: 7.4 g/dL (ref 6.0–8.3)

## 2023-01-04 LAB — LIPID PANEL
Cholesterol: 182 mg/dL (ref 0–200)
HDL: 39.3 mg/dL (ref >39.00)
LDL Cholesterol: 124 mg/dL — ABNORMAL HIGH (ref 0–99)
NonHDL: 142.96
Total CHOL/HDL Ratio: 5
Triglycerides: 94 mg/dL (ref 0.0–149.0)
VLDL: 18.8 mg/dL (ref 0.0–40.0)

## 2023-01-04 LAB — HIV ANTIBODY (ROUTINE TESTING W REFLEX): HIV 1&2 Ab, 4th Generation: NONREACTIVE

## 2023-01-04 LAB — HEPATITIS C ANTIBODY: Hepatitis C Ab: NONREACTIVE

## 2023-01-06 ENCOUNTER — Other Ambulatory Visit: Payer: Self-pay | Admitting: Family Medicine

## 2023-01-06 ENCOUNTER — Encounter: Payer: Self-pay | Admitting: Family Medicine

## 2023-01-06 DIAGNOSIS — R790 Abnormal level of blood mineral: Secondary | ICD-10-CM

## 2023-02-10 ENCOUNTER — Other Ambulatory Visit: Payer: 59

## 2023-02-10 DIAGNOSIS — R790 Abnormal level of blood mineral: Secondary | ICD-10-CM | POA: Diagnosis not present

## 2023-02-10 NOTE — Addendum Note (Signed)
Addended by: Maximino Sarin on: 02/10/2023 02:16 PM   Modules accepted: Orders

## 2023-02-11 LAB — CBC
HCT: 39.1 % (ref 35.0–45.0)
Hemoglobin: 12.5 g/dL (ref 11.7–15.5)
MCH: 26.8 pg — ABNORMAL LOW (ref 27.0–33.0)
MCHC: 32 g/dL (ref 32.0–36.0)
MCV: 83.9 fL (ref 80.0–100.0)
MPV: 13.4 fL — ABNORMAL HIGH (ref 7.5–12.5)
Platelets: 256 10*3/uL (ref 140–400)
RBC: 4.66 10*6/uL (ref 3.80–5.10)
RDW: 13.6 % (ref 11.0–15.0)
WBC: 7.2 10*3/uL (ref 3.8–10.8)

## 2023-02-11 LAB — IRON,TIBC AND FERRITIN PANEL
%SAT: 26 % (ref 16–45)
Ferritin: 55 ng/mL (ref 16–154)
Iron: 80 ug/dL (ref 40–190)
TIBC: 313 ug/dL (ref 250–450)

## 2023-03-08 ENCOUNTER — Encounter: Payer: Self-pay | Admitting: Family Medicine

## 2023-03-27 DIAGNOSIS — Z01419 Encounter for gynecological examination (general) (routine) without abnormal findings: Secondary | ICD-10-CM | POA: Diagnosis not present

## 2023-03-27 DIAGNOSIS — Z6828 Body mass index (BMI) 28.0-28.9, adult: Secondary | ICD-10-CM | POA: Diagnosis not present

## 2024-01-25 IMAGING — CT CT ABD-PELV W/ CM
2 of 4 series · 13 of 46 positions shown, 15 images · IV contrast (agent unspecified)
Comparison: None.

CLINICAL DATA: History of right ovarian cyst

EXAM:
CT ABDOMEN AND PELVIS WITH CONTRAST
TECHNIQUE: Multidetector CT imaging of the abdomen and pelvis was performed
using the standard protocol following bolus administration of
intravenous contrast.

[Series 2: abd pelvis 5.00 br40 s3 axial · axial · 0.64mm/px · z∈[+1204,+1604]mm · 10 of 98 slices shown, 12 images]
[im 9/98  soft-tissue]
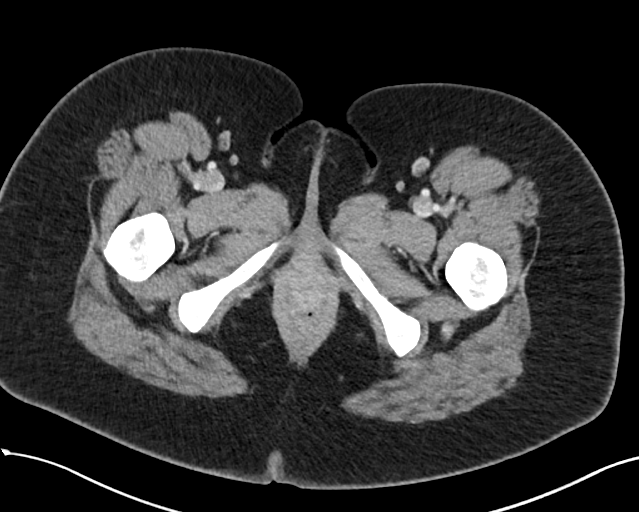
[im 9/98  bone]
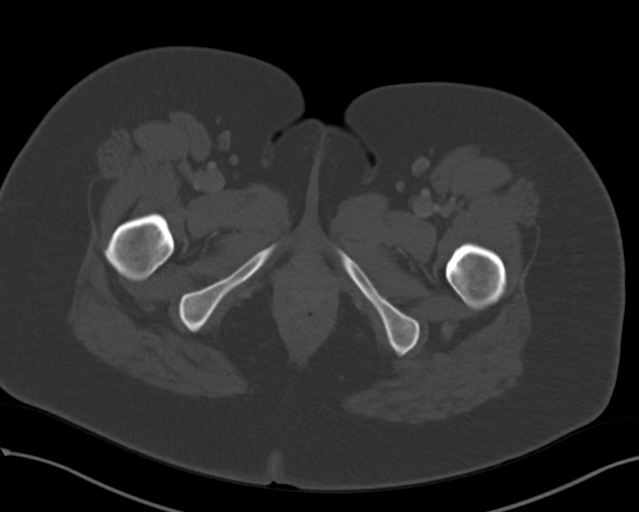
[im 17/98  soft-tissue]
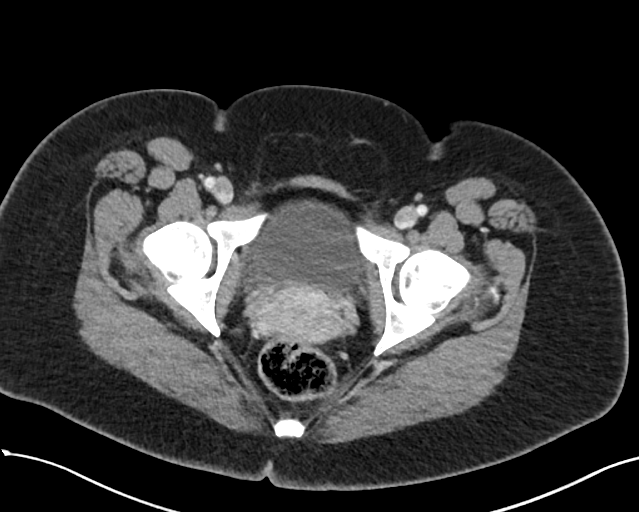
[im 25/98  soft-tissue]
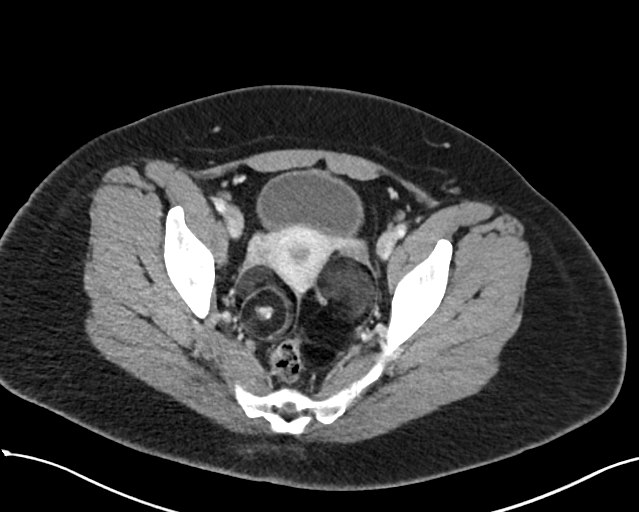
[im 37/98  soft-tissue]
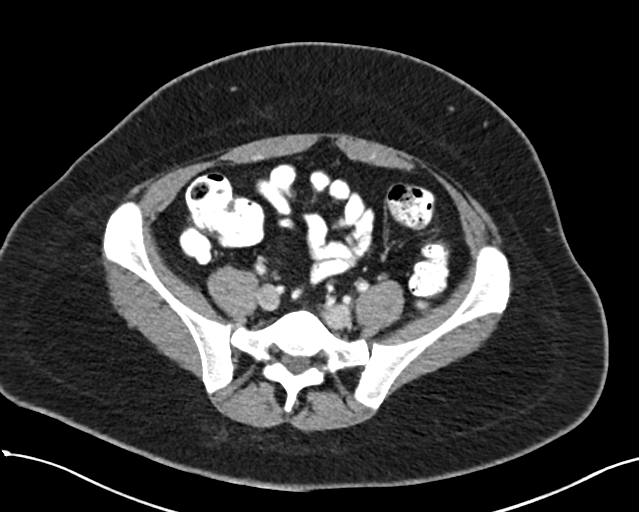
[im 45/98  soft-tissue]
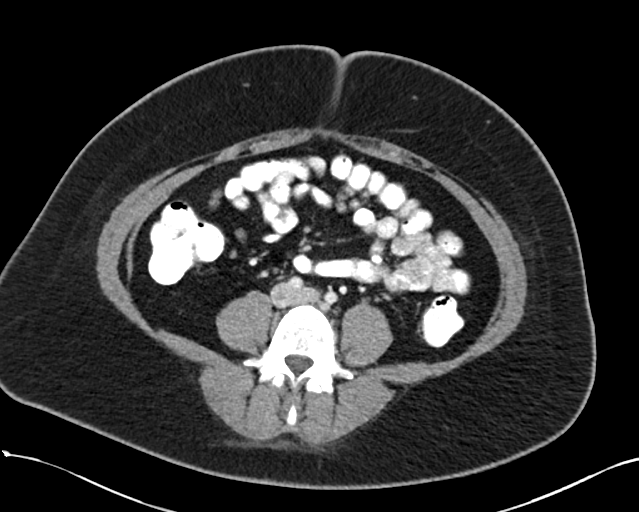
[im 53/98  soft-tissue]
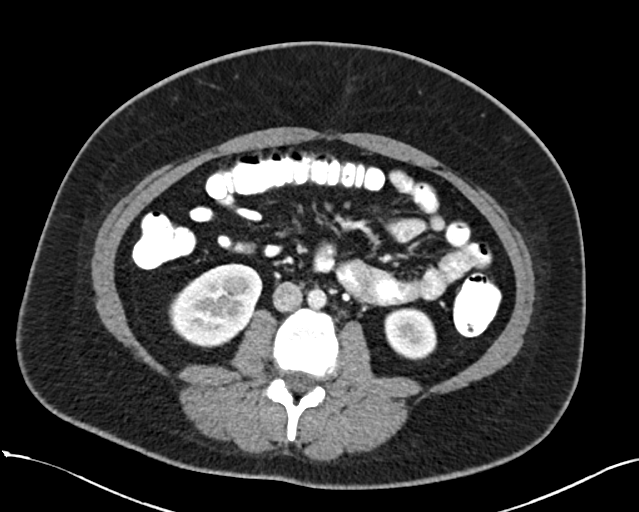
[im 61/98  soft-tissue]
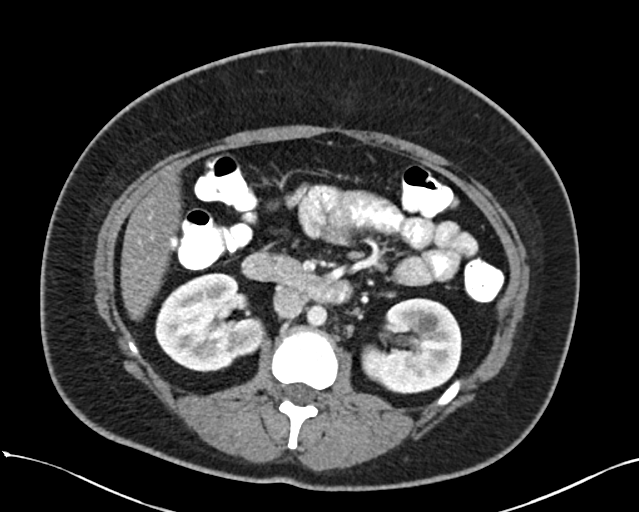
[im 73/98  soft-tissue]
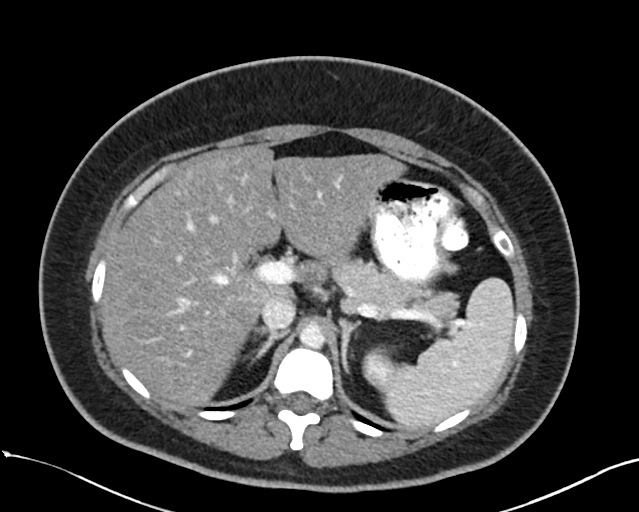
[im 81/98  soft-tissue]
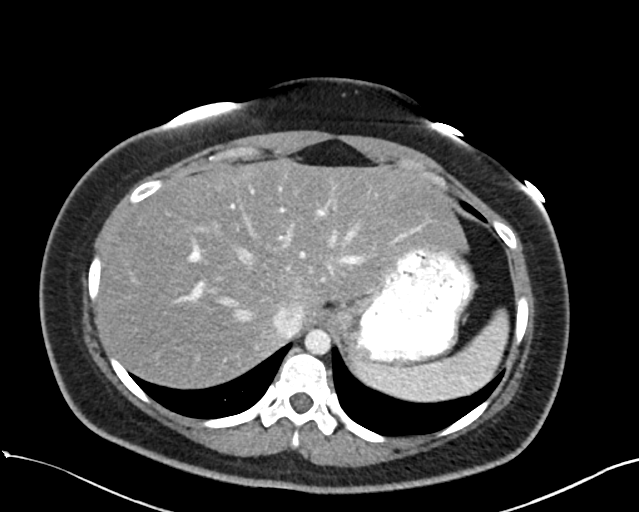
[im 81/98  bone]
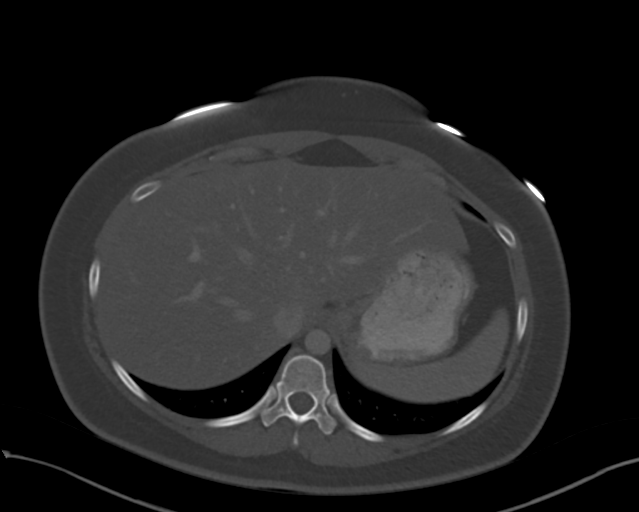
[im 89/98  soft-tissue]
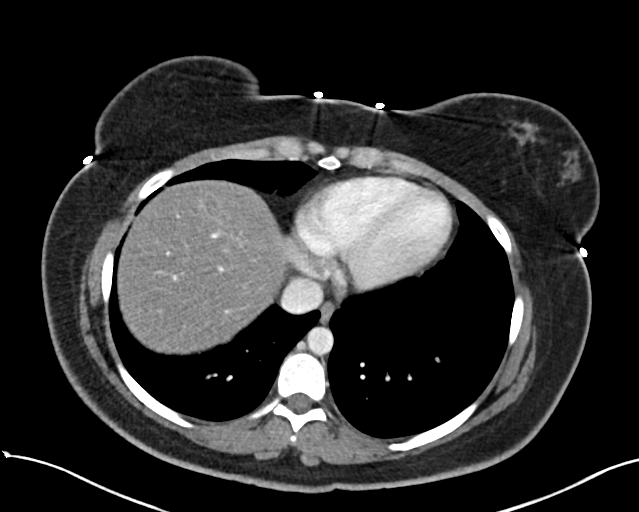

[Series 6: abd pelvis 2.00 br40 s3 cor · coronal · 0.79mm/px · 3 of 149 slices shown]
[im 50/149  soft-tissue]
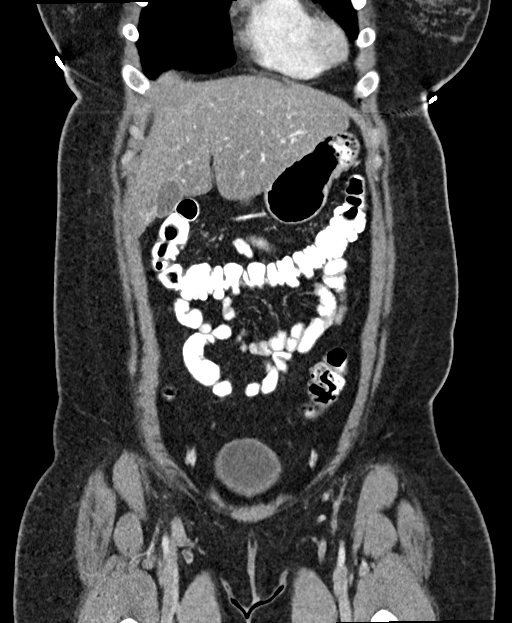
[im 66/149  soft-tissue]
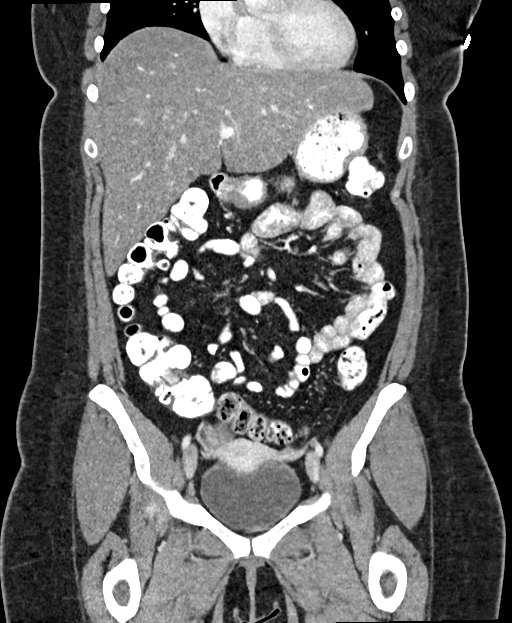
[im 83/149  soft-tissue]
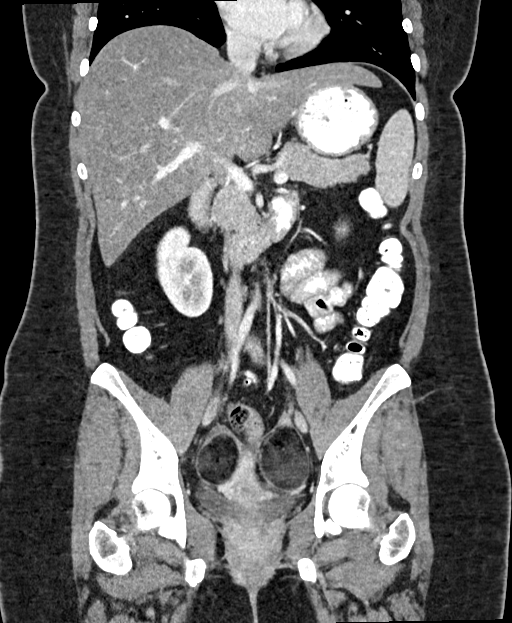

[13 of 46 positions shown; findings below may reference images not displayed]

RADIATION DOSE REDUCTION: This exam was performed according to the
departmental dose-optimization program which includes automated
exposure control, adjustment of the mA and/or kV according to
patient size and/or use of iterative reconstruction technique.

CONTRAST:  100mL [id number] IOPAMIDOL ([id number]) INJECTION 61%
FINDINGS: Lower chest: No acute abnormality

Hepatobiliary: Diffuse low-density throughout the liver compatible
with fatty infiltration. Focal enhancing area posteriorly in the
right hepatic lobe measuring 12 mm, favor flash filling of a
hemangioma. Gallbladder unremarkable.

Pancreas: No focal abnormality or ductal dilatation.

Spleen: No focal abnormality.  Normal size.

Adrenals/Urinary Tract: 2.7 cm simple appearing cyst in the mid to
upper pole of the right kidney. Adrenal glands and left kidney
unremarkable. No hydronephrosis. Urinary bladder unremarkable.

Stomach/Bowel: Normal appendix. Stomach, large and small bowel
grossly unremarkable.

Vascular/Lymphatic: No evidence of aneurysm or adenopathy.

Reproductive: Uterus unremarkable. Bilateral fat containing masses
in both ovaries/adnexa, measuring up to 4.8 cm on the right and
cm on the left compatible dermoids/teratomas.

Other: No free fluid or free air.

Musculoskeletal: No acute bony abnormality.
IMPRESSION: Bilateral ovarian masses, predominantly fat containing compatible
with dermoids/teratomas.

Hepatic steatosis.

12 mm enhancing area in the posterior liver likely reflects flash
filling of a hemangioma. This could be confirmed with ultrasound if
felt clinically indicated.

## 2024-02-19 ENCOUNTER — Ambulatory Visit (INDEPENDENT_AMBULATORY_CARE_PROVIDER_SITE_OTHER): Admitting: Family Medicine

## 2024-02-19 ENCOUNTER — Encounter: Payer: Self-pay | Admitting: Family Medicine

## 2024-02-19 VITALS — BP 122/82 | HR 93 | Temp 98.0°F | Resp 16 | Ht 63.0 in | Wt 183.6 lb

## 2024-02-19 DIAGNOSIS — R1033 Periumbilical pain: Secondary | ICD-10-CM | POA: Diagnosis not present

## 2024-02-19 NOTE — Patient Instructions (Addendum)
 Give us  2-3 business days to get the results of your labs back.   Try to drink 55-60 oz of water daily outside of exercise.  Take a prenatal vitamin.   Let us  know if you need anything.

## 2024-02-19 NOTE — Progress Notes (Signed)
 Chief Complaint  Patient presents with   Abdominal Pain    Abdominal Pain and Sweating    Subjective: Patient is a 28 y.o. female here for abdominal pain.  Over the past few years, the patient will get intermittent periumbilical abdominal pain.  It would last for 15 to 20 minutes and go away by itself.  It seems to happen after eating. No correlation w foods. No fevers, vomiting, diarrhea, constipation, bleeding, urinary complaints, trauma, increased stress. She has not tried anything at home for this. Drinks around 32 oz of water daily. No wt loss or nighttime awakenings.    Past Medical History:  Diagnosis Date   Anxiety    History of kidney stones 2012-03-13   passed on own    Objective: BP 122/82 (BP Location: Left Arm, Patient Position: Sitting)   Pulse 93   Temp 98 F (36.7 C) (Oral)   Resp 16   Ht 5' 3 (1.6 m)   Wt 183 lb 9.6 oz (83.3 kg)   SpO2 98%   BMI 32.52 kg/m  General: Awake, appears stated age Heart: RRR, no LE edema Lungs: CTAB, no rales, wheezes or rhonchi. No accessory muscle use Abd: BS+, S, mild ttp around umbilical region, ND Mouth: MMD Psych: Age appropriate judgment and insight, normal affect and mood  Assessment and Plan: Periumbilical abdominal pain - Plan: CBC, Comprehensive metabolic panel with GFR, TSH, Hemoglobin A1c, Urine Culture, Urinalysis, Routine w reflex microscopic, Beta hCG quant (ref lab)  Ck above. If neg, will consider PO Pepcid. Increase hydration to 55-60 oz of water daily.  The patient voiced understanding and agreement to the plan.  Mabel [place], DO 02/19/24  5:19 PM

## 2024-02-20 ENCOUNTER — Ambulatory Visit: Payer: Self-pay | Admitting: Family Medicine

## 2024-02-20 LAB — URINALYSIS, ROUTINE W REFLEX MICROSCOPIC
Bilirubin Urine: NEGATIVE
Hgb urine dipstick: NEGATIVE
Ketones, ur: NEGATIVE
Nitrite: NEGATIVE
RBC / HPF: NONE SEEN (ref 0–?)
Specific Gravity, Urine: <=1.005 — AB (ref 1.000–1.030)
Total Protein, Urine: NEGATIVE
Urine Glucose: NEGATIVE
Urobilinogen, UA: 0.2 (ref 0.0–1.0)
pH: 6.5 (ref 5.0–8.0)

## 2024-02-20 LAB — CBC
HCT: 37.1 % (ref 36.0–46.0)
Hemoglobin: 12 g/dL (ref 12.0–15.0)
MCHC: 32.5 g/dL (ref 30.0–36.0)
MCV: 82 fl (ref 78.0–100.0)
Platelets: 266 K/uL (ref 150.0–400.0)
RBC: 4.52 Mil/uL (ref 3.87–5.11)
RDW: 13.9 % (ref 11.5–15.5)
WBC: 7.8 K/uL (ref 4.0–10.5)

## 2024-02-20 LAB — COMPREHENSIVE METABOLIC PANEL WITH GFR
ALT: 15 U/L (ref 0–35)
AST: 12 U/L (ref 0–37)
Albumin: 4.5 g/dL (ref 3.5–5.2)
Alkaline Phosphatase: 66 U/L (ref 39–117)
BUN: 6 mg/dL (ref 6–23)
CO2: 27 meq/L (ref 19–32)
Calcium: 9.5 mg/dL (ref 8.4–10.5)
Chloride: 102 meq/L (ref 96–112)
Creatinine, Ser: 0.48 mg/dL (ref 0.40–1.20)
GFR: 127.6 mL/min (ref >60.00)
Glucose, Bld: 124 mg/dL — ABNORMAL HIGH (ref 70–99)
Potassium: 3.9 meq/L (ref 3.5–5.1)
Sodium: 140 meq/L (ref 135–145)
Total Bilirubin: 0.4 mg/dL (ref 0.2–1.2)
Total Protein: 7 g/dL (ref 6.0–8.3)

## 2024-02-20 LAB — URINE CULTURE
MICRO NUMBER:: 17121273
SPECIMEN QUALITY:: ADEQUATE

## 2024-02-20 LAB — TSH: TSH: 0.95 u[IU]/mL (ref 0.35–5.50)

## 2024-02-20 LAB — BETA HCG QUANT (REF LAB): hCG Quant: <1 m[IU]/mL

## 2024-02-20 LAB — HEMOGLOBIN A1C: Hgb A1c MFr Bld: 5.8 % (ref 4.6–6.5)
